# Patient Record
Sex: Male | Born: 1975 | Race: White | Hispanic: No | Marital: Single | Smoking: Never smoker
Health system: Southern US, Community
[De-identification: ages and names within clinical notes are randomized; demographics above are authoritative.]

## PROBLEM LIST (undated history)

## (undated) DIAGNOSIS — I10 Essential (primary) hypertension: Secondary | ICD-10-CM

## (undated) DIAGNOSIS — L709 Acne, unspecified: Secondary | ICD-10-CM

## (undated) DIAGNOSIS — K589 Irritable bowel syndrome without diarrhea: Secondary | ICD-10-CM

## (undated) DIAGNOSIS — E785 Hyperlipidemia, unspecified: Secondary | ICD-10-CM

## (undated) HISTORY — DX: Hyperlipidemia, unspecified: E78.5

## (undated) SURGERY — APPENDECTOMY, LAPAROSCOPIC
Anesthesia: General

---

## 2011-09-16 ENCOUNTER — Inpatient Hospital Stay (HOSPITAL_COMMUNITY)
Admission: EM | Admit: 2011-09-16 | Discharge: 2011-09-18 | DRG: 340 | Disposition: A | Discharge status: Home / Self Care | Payer: Commercial Managed Care - PPO | Attending: Surgery | Admitting: Surgery

## 2011-09-16 ENCOUNTER — Encounter: Payer: Self-pay | Admitting: Emergency Medicine

## 2011-09-16 DIAGNOSIS — L708 Other acne: Secondary | ICD-10-CM | POA: Diagnosis present

## 2011-09-16 DIAGNOSIS — K3580 Unspecified acute appendicitis: Secondary | ICD-10-CM | POA: Diagnosis present

## 2011-09-16 DIAGNOSIS — K352 Acute appendicitis with generalized peritonitis, without abscess: Principal | ICD-10-CM | POA: Diagnosis present

## 2011-09-16 DIAGNOSIS — I10 Essential (primary) hypertension: Secondary | ICD-10-CM | POA: Diagnosis present

## 2011-09-16 DIAGNOSIS — K589 Irritable bowel syndrome without diarrhea: Secondary | ICD-10-CM | POA: Diagnosis present

## 2011-09-16 DIAGNOSIS — K35209 Acute appendicitis with generalized peritonitis, without abscess, unspecified as to perforation: Principal | ICD-10-CM | POA: Diagnosis present

## 2011-09-16 DIAGNOSIS — Z79899 Other long term (current) drug therapy: Secondary | ICD-10-CM

## 2011-09-16 HISTORY — DX: Irritable bowel syndrome, unspecified: K58.9

## 2011-09-16 HISTORY — DX: Acne, unspecified: L70.9

## 2011-09-16 HISTORY — DX: Essential (primary) hypertension: I10

## 2011-09-16 LAB — DIFFERENTIAL
Eosinophils Relative: 0 % (ref 0–5)
Lymphocytes Relative: 2 % — ABNORMAL LOW (ref 12–46)
Lymphs Abs: 0.3 10*3/uL — ABNORMAL LOW (ref 0.7–4.0)
Monocytes Absolute: 0.6 10*3/uL (ref 0.1–1.0)
Monocytes Relative: 3 % (ref 3–12)

## 2011-09-16 LAB — CBC
HCT: 43 % (ref 39.0–52.0)
MCV: 90.9 fL (ref 78.0–100.0)
RBC: 4.73 MIL/uL (ref 4.22–5.81)
WBC: 17.4 10*3/uL — ABNORMAL HIGH (ref 4.0–10.5)

## 2011-09-16 NOTE — ED Notes (Signed)
PT. REPORTS UPPER ABDOMINAL PAIN WITH NAUSEA , NO VOMITTING OR DIARRHEA ONSET LAST NIGHT ,  ALSO REPORTS CONSTIPATION ONSET TODAY.

## 2011-09-17 ENCOUNTER — Encounter (HOSPITAL_COMMUNITY): Admission: EM | Disposition: A | Discharge status: Home / Self Care | Payer: Self-pay | Source: Home / Self Care

## 2011-09-17 ENCOUNTER — Encounter (HOSPITAL_COMMUNITY): Payer: Self-pay | Admitting: Radiology

## 2011-09-17 ENCOUNTER — Emergency Department (HOSPITAL_COMMUNITY): Payer: Commercial Managed Care - PPO

## 2011-09-17 ENCOUNTER — Emergency Department (HOSPITAL_COMMUNITY): Payer: Commercial Managed Care - PPO | Admitting: Anesthesiology

## 2011-09-17 ENCOUNTER — Encounter (HOSPITAL_COMMUNITY): Payer: Self-pay

## 2011-09-17 ENCOUNTER — Encounter (HOSPITAL_COMMUNITY): Payer: Self-pay | Admitting: Anesthesiology

## 2011-09-17 ENCOUNTER — Other Ambulatory Visit (INDEPENDENT_AMBULATORY_CARE_PROVIDER_SITE_OTHER): Payer: Self-pay | Admitting: Surgery

## 2011-09-17 DIAGNOSIS — K3580 Unspecified acute appendicitis: Secondary | ICD-10-CM

## 2011-09-17 DIAGNOSIS — R1031 Right lower quadrant pain: Secondary | ICD-10-CM

## 2011-09-17 HISTORY — PX: LAPAROSCOPIC APPENDECTOMY: SHX408

## 2011-09-17 HISTORY — PX: APPENDECTOMY: SHX54

## 2011-09-17 LAB — CBC
Hemoglobin: 13 g/dL (ref 13.0–17.0)
MCH: 31.3 pg (ref 26.0–34.0)
MCH: 30.7 pg (ref 26.0–34.0)
MCHC: 34.1 g/dL (ref 30.0–36.0)
Platelets: 193 10*3/uL (ref 150–400)
RBC: 4.5 MIL/uL (ref 4.22–5.81)
RBC: 4.23 MIL/uL (ref 4.22–5.81)
RDW: 12.5 % (ref 11.5–15.5)
WBC: 15.6 10*3/uL — ABNORMAL HIGH (ref 4.0–10.5)

## 2011-09-17 LAB — URINE MICROSCOPIC-ADD ON

## 2011-09-17 LAB — CREATININE, SERUM
Creatinine, Ser: 0.74 mg/dL (ref 0.50–1.35)
GFR calc Af Amer: >90 mL/min (ref >90)
GFR calc non Af Amer: >90 mL/min (ref >90)

## 2011-09-17 LAB — COMPREHENSIVE METABOLIC PANEL
ALT: 17 U/L (ref 0–53)
CO2: 22 mEq/L (ref 19–32)
Calcium: 10.1 mg/dL (ref 8.4–10.5)
Creatinine, Ser: 0.82 mg/dL (ref 0.50–1.35)
GFR calc Af Amer: >90 mL/min (ref >90)
GFR calc non Af Amer: >90 mL/min (ref >90)
Glucose, Bld: 137 mg/dL — ABNORMAL HIGH (ref 70–99)
Sodium: 133 mEq/L — ABNORMAL LOW (ref 135–145)

## 2011-09-17 LAB — URINALYSIS, ROUTINE W REFLEX MICROSCOPIC
Nitrite: NEGATIVE
Specific Gravity, Urine: 1.027 (ref 1.005–1.030)
Urobilinogen, UA: 1 mg/dL (ref 0.0–1.0)

## 2011-09-17 LAB — LIPASE, BLOOD: Lipase: 14 U/L (ref 11–59)

## 2011-09-17 SURGERY — APPENDECTOMY, LAPAROSCOPIC
Anesthesia: General | Site: Abdomen | Wound class: Contaminated

## 2011-09-17 MED ORDER — ACETAMINOPHEN 325 MG PO TABS: 650.0000 mg | ORAL_TABLET | ORAL | Status: DC | PRN

## 2011-09-17 MED ORDER — SODIUM CHLORIDE 0.9 % IV SOLN
1.0000 g | Freq: Once | INTRAVENOUS | Status: DC
  Filled 2011-09-17: qty 1

## 2011-09-17 MED ORDER — PROPOFOL 10 MG/ML IV EMUL
INTRAVENOUS | Status: DC | PRN
  Administered 2011-09-17: 200 mg via INTRAVENOUS

## 2011-09-17 MED ORDER — GLYCOPYRROLATE 0.2 MG/ML IJ SOLN
INTRAMUSCULAR | Status: DC | PRN
  Administered 2011-09-17: 1 mg via INTRAVENOUS

## 2011-09-17 MED ORDER — HYDROCODONE-ACETAMINOPHEN 5-325 MG PO TABS
1.0000 | ORAL_TABLET | ORAL | Status: DC | PRN
  Administered 2011-09-18: 2 via ORAL
  Filled 2011-09-17: qty 2

## 2011-09-17 MED ORDER — MORPHINE SULFATE 2 MG/ML IJ SOLN
1.0000 mg | INTRAMUSCULAR | Status: DC | PRN
  Administered 2011-09-17: 2 mg via INTRAVENOUS
  Filled 2011-09-17: qty 1

## 2011-09-17 MED ORDER — HYDROMORPHONE HCL PF 1 MG/ML IJ SOLN
1.0000 mg | Freq: Once | INTRAMUSCULAR | Status: AC
  Administered 2011-09-17: 1 mg via INTRAVENOUS
  Filled 2011-09-17: qty 1

## 2011-09-17 MED ORDER — HYDROMORPHONE HCL PF 1 MG/ML IJ SOLN
0.2500 mg | INTRAMUSCULAR | Status: DC | PRN
  Administered 2011-09-17 (×2): 0.25 mg via INTRAVENOUS
  Administered 2011-09-17: 0.5 mg via INTRAVENOUS

## 2011-09-17 MED ORDER — MEPERIDINE HCL 25 MG/ML IJ SOLN: 6.2500 mg | INTRAMUSCULAR | Status: DC | PRN

## 2011-09-17 MED ORDER — ENOXAPARIN SODIUM 40 MG/0.4ML ~~LOC~~ SOLN
40.0000 mg | SUBCUTANEOUS | Status: DC
  Administered 2011-09-18: 40 mg via SUBCUTANEOUS
  Filled 2011-09-17 (×2): qty 0.4

## 2011-09-17 MED ORDER — MORPHINE SULFATE 2 MG/ML IJ SOLN
2.0000 mg | INTRAMUSCULAR | Status: DC | PRN
  Administered 2011-09-17 (×2): 2 mg via INTRAVENOUS
  Filled 2011-09-17 (×2): qty 1

## 2011-09-17 MED ORDER — LISINOPRIL 20 MG PO TABS
20.0000 mg | ORAL_TABLET | Freq: Every day | ORAL | Status: DC
  Administered 2011-09-17 – 2011-09-18 (×2): 20 mg via ORAL
  Filled 2011-09-17 (×2): qty 1

## 2011-09-17 MED ORDER — LACTATED RINGERS IV SOLN
INTRAVENOUS | Status: DC | PRN
  Administered 2011-09-17 (×2): via INTRAVENOUS

## 2011-09-17 MED ORDER — BUPIVACAINE-EPINEPHRINE 0.5% -1:200000 IJ SOLN
INTRAMUSCULAR | Status: DC | PRN
  Administered 2011-09-17: 20 mL

## 2011-09-17 MED ORDER — PROMETHAZINE HCL 25 MG/ML IJ SOLN: 12.5000 mg | Freq: Four times a day (QID) | INTRAMUSCULAR | Status: DC | PRN

## 2011-09-17 MED ORDER — SODIUM CHLORIDE 0.9 % IV SOLN
1.0000 g | INTRAVENOUS | Status: DC
  Filled 2011-09-17: qty 1

## 2011-09-17 MED ORDER — KCL IN DEXTROSE-NACL 20-5-0.9 MEQ/L-%-% IV SOLN
INTRAVENOUS | Status: DC
  Administered 2011-09-17 – 2011-09-18 (×2): via INTRAVENOUS
  Filled 2011-09-17 (×7): qty 1000

## 2011-09-17 MED ORDER — POTASSIUM CHLORIDE IN NACL 20-0.9 MEQ/L-% IV SOLN
INTRAVENOUS | Status: DC
  Filled 2011-09-17 (×2): qty 1000

## 2011-09-17 MED ORDER — ONDANSETRON HCL 4 MG/2ML IJ SOLN
4.0000 mg | Freq: Once | INTRAMUSCULAR | Status: AC
  Administered 2011-09-17: 4 mg via INTRAVENOUS
  Filled 2011-09-17: qty 2

## 2011-09-17 MED ORDER — MIDAZOLAM HCL 5 MG/5ML IJ SOLN
INTRAMUSCULAR | Status: DC | PRN
  Administered 2011-09-17: 2 mg via INTRAVENOUS

## 2011-09-17 MED ORDER — KETOROLAC TROMETHAMINE 30 MG/ML IJ SOLN
30.0000 mg | Freq: Four times a day (QID) | INTRAMUSCULAR | Status: DC
  Administered 2011-09-17 – 2011-09-18 (×4): 30 mg via INTRAVENOUS
  Filled 2011-09-17 (×8): qty 1

## 2011-09-17 MED ORDER — SODIUM CHLORIDE 0.9 % IR SOLN
Status: DC | PRN
  Administered 2011-09-17: 2000 mL

## 2011-09-17 MED ORDER — FENTANYL CITRATE 0.05 MG/ML IJ SOLN
INTRAMUSCULAR | Status: DC | PRN
  Administered 2011-09-17: 100 ug via INTRAVENOUS

## 2011-09-17 MED ORDER — PROMETHAZINE HCL 25 MG/ML IJ SOLN: 6.2500 mg | INTRAMUSCULAR | Status: DC | PRN

## 2011-09-17 MED ORDER — ATROPINE SULFATE 1 MG/ML IJ SOLN
INTRAMUSCULAR | Status: DC | PRN
  Administered 2011-09-17: .6 mg via INTRAVENOUS

## 2011-09-17 MED ORDER — CEFAZOLIN SODIUM 1-5 GM-% IV SOLN
INTRAVENOUS | Status: DC | PRN
  Administered 2011-09-17: 1 g via INTRAVENOUS

## 2011-09-17 MED ORDER — DIPHENHYDRAMINE HCL 50 MG/ML IJ SOLN: 12.5000 mg | Freq: Four times a day (QID) | INTRAMUSCULAR | Status: DC | PRN

## 2011-09-17 MED ORDER — VECURONIUM BROMIDE 10 MG IV SOLR
INTRAVENOUS | Status: DC | PRN
  Administered 2011-09-17: 5 mg via INTRAVENOUS

## 2011-09-17 MED ORDER — LACTATED RINGERS IV SOLN
INTRAVENOUS | Status: DC
  Administered 2011-09-17: 10:00:00 via INTRAVENOUS

## 2011-09-17 MED ORDER — IOHEXOL 300 MG/ML  SOLN
100.0000 mL | Freq: Once | INTRAMUSCULAR | Status: AC | PRN
  Administered 2011-09-17: 100 mL via INTRAVENOUS

## 2011-09-17 MED ORDER — SUCCINYLCHOLINE CHLORIDE 20 MG/ML IJ SOLN
INTRAMUSCULAR | Status: DC | PRN
  Administered 2011-09-17: 100 mg via INTRAVENOUS

## 2011-09-17 MED ORDER — DIPHENHYDRAMINE HCL 12.5 MG/5ML PO ELIX
12.5000 mg | ORAL_SOLUTION | Freq: Four times a day (QID) | ORAL | Status: DC | PRN
  Filled 2011-09-17: qty 10

## 2011-09-17 MED ORDER — SODIUM CHLORIDE 0.9 % IV BOLUS (SEPSIS)
1000.0000 mL | Freq: Once | INTRAVENOUS | Status: AC
  Administered 2011-09-17: 1000 mL via INTRAVENOUS

## 2011-09-17 MED ORDER — NEOSTIGMINE METHYLSULFATE 1 MG/ML IJ SOLN
INTRAMUSCULAR | Status: DC | PRN
  Administered 2011-09-17: 5 mg via INTRAVENOUS

## 2011-09-17 SURGICAL SUPPLY — 37 items
APPLIER CLIP LOGIC TI 5 (MISCELLANEOUS) IMPLANT
APPLIER CLIP ROT 10 11.4 M/L (STAPLE)
BANDAGE ADHESIVE 1X3 (GAUZE/BANDAGES/DRESSINGS) ×6 IMPLANT
BENZOIN TINCTURE PRP APPL 2/3 (GAUZE/BANDAGES/DRESSINGS) ×2 IMPLANT
CANISTER SUCTION 2500CC (MISCELLANEOUS) ×2 IMPLANT
CHLORAPREP W/TINT 26ML (MISCELLANEOUS) ×2 IMPLANT
CLIP APPLIE ROT 10 11.4 M/L (STAPLE) IMPLANT
CLOTH BEACON ORANGE TIMEOUT ST (SAFETY) ×2 IMPLANT
COVER SURGICAL LIGHT HANDLE (MISCELLANEOUS) ×2 IMPLANT
CUTTER LINEAR ENDO 35 ETS (STAPLE) ×2 IMPLANT
CUTTER LINEAR ENDO 35 ETS TH (STAPLE) IMPLANT
DECANTER SPIKE VIAL GLASS SM (MISCELLANEOUS) ×2 IMPLANT
ELECT REM PT RETURN 9FT ADLT (ELECTROSURGICAL) ×2
ELECTRODE REM PT RTRN 9FT ADLT (ELECTROSURGICAL) ×1 IMPLANT
ENDOLOOP SUT PDS II  0 18 (SUTURE)
ENDOLOOP SUT PDS II 0 18 (SUTURE) IMPLANT
GLOVE SURG SIGNA 7.5 PF LTX (GLOVE) ×2 IMPLANT
GOWN PREVENTION PLUS XLARGE (GOWN DISPOSABLE) ×2 IMPLANT
GOWN STRL NON-REIN LRG LVL3 (GOWN DISPOSABLE) ×2 IMPLANT
KIT BASIN OR (CUSTOM PROCEDURE TRAY) ×2 IMPLANT
KIT ROOM TURNOVER OR (KITS) ×2 IMPLANT
NS IRRIG 1000ML POUR BTL (IV SOLUTION) ×2 IMPLANT
PAD ARMBOARD 7.5X6 YLW CONV (MISCELLANEOUS) ×2 IMPLANT
POUCH SPECIMEN RETRIEVAL 10MM (ENDOMECHANICALS) ×2 IMPLANT
RELOAD /EVU35 (ENDOMECHANICALS) IMPLANT
RELOAD CUTTER ETS 35MM STAND (ENDOMECHANICALS) IMPLANT
SCALPEL HARMONIC ACE (MISCELLANEOUS) ×2 IMPLANT
SET IRRIG TUBING LAPAROSCOPIC (IRRIGATION / IRRIGATOR) ×2 IMPLANT
SLEEVE ENDOPATH XCEL 5M (ENDOMECHANICALS) ×2 IMPLANT
SPECIMEN JAR SMALL (MISCELLANEOUS) ×2 IMPLANT
SUT MON AB 4-0 PC3 18 (SUTURE) ×2 IMPLANT
TOWEL OR 17X24 6PK STRL BLUE (TOWEL DISPOSABLE) ×2 IMPLANT
TOWEL OR 17X26 10 PK STRL BLUE (TOWEL DISPOSABLE) ×2 IMPLANT
TRAY LAPAROSCOPIC (CUSTOM PROCEDURE TRAY) ×2 IMPLANT
TROCAR XCEL BLUNT TIP 100MML (ENDOMECHANICALS) ×4 IMPLANT
TROCAR XCEL NON-BLD 5MMX100MML (ENDOMECHANICALS) ×2 IMPLANT
WATER STERILE IRR 1000ML POUR (IV SOLUTION) IMPLANT

## 2011-09-17 NOTE — ED Notes (Signed)
Patient presents with c/o pain to the epigastric area.  Stated that today he was unable to have a BM and took olive oil and OJ together.  Had no results.  Abd tender to touch denies N/V.  Urine sent to the lab

## 2011-09-17 NOTE — Op Note (Signed)
Appendectomy, Lap, Procedure Note  Indications: The patient presented with a history of right-sided abdominal pain. A CT revealed findings consistent with acute appendicitis.  Pre-operative Diagnosis: Acute appendicitis with generalized peritonitis  Post-operative Diagnosis: Acute appendicitis with generalized peritonitis  Surgeon: Abigail Miyamoto A   Assistants:   Anesthesia: General endotracheal anesthesia  ASA Class: 1  Procedure Details  The patient was seen again in the Holding Room. The risks, benefits, complications, treatment options, and expected outcomes were discussed with the patient and/or family. The possibilities of reaction to medication, perforation of viscus, bleeding, recurrent infection, finding a normal appendix, the need for additional procedures, failure to diagnose a condition, and creating a complication requiring transfusion or operation were discussed. There was concurrence with the proposed plan and informed consent was obtained. The site of surgery was properly noted. The patient was taken to Operating Room, identified as Ronte Conley-Biggs and the procedure verified as Appendectomy. A Time Out was held and the above information confirmed.  The patient was placed in the supine position and general anesthesia was induced, along with placement of orogastric tube, Venodyne boots, and a Foley catheter. The abdomen was prepped and draped in a sterile fashion. A one centimeter infraumbilical incision was made.  The umbilical stalk was elevated, and the midline fascia was incised with a #11 blade.  A Kelly clamp was used to confirm entrance into the peritoneal cavity.  A pursestring suture was passed around the incision with a 0 Vicryl.  The Hasson was introduced into the abdomen and the tails of the suture were used to hold the Hasson in place.   The pneumoperitoneum was then established to steady pressure of 15 mmHg.  Additional 5 mm cannulas then placed in the left  lower quadrant of the abdomen and the suprapubic region under direct visualization. A careful evaluation of the entire abdomen was carried out. The patient was placed in Trendelenburg and left lateral decubitus position. The small intestines were retracted in the cephalad and left lateral direction away from the pelvis and right lower quadrant. The patient was found to have an enlarged and inflamed appendix that was extending into the pelvis. There was no evidence of perforation.  The appendix was carefully dissected. The appendix was was skeletonized with the harmonic scalpel.   The appendix was divided at its base using an endo-GIA stapler. Minimal appendiceal stump was left in place. There was no evidence of bleeding, leakage, or complication after division of the appendix. Irrigation was also performed and irrigate suctioned from the abdomen as well.  The umbilical port site was closed with the purse string suture. There was no residual palpable fascial defect.  The trocar site skin wounds were closed with 4-0 Monocryl.  Instrument, sponge, and needle counts were correct at the conclusion of the case.   Findings: The appendix was found to be inflamed. There were signs of necrosis.  There was not perforation. There was not abscess formation.  Estimated Blood Loss:  Minimal         Drains: 0                Specimens: appendix         Complications:  None; patient tolerated the procedure well.         Disposition: PACU - hemodynamically stable.         Condition: stable

## 2011-09-17 NOTE — ED Notes (Signed)
Introduced self to pt. Pt remains on cardiac monitor, vital signs stable. Pt reports that his pain is at good level at this time. No acute distress noted. Will continue to monitor.

## 2011-09-17 NOTE — Progress Notes (Signed)
All checklist information confirmed with Patient in Holding 

## 2011-09-17 NOTE — Anesthesia Postprocedure Evaluation (Signed)
  Anesthesia Post-op Note  Patient: Dakota Harris  Procedure(s) Performed:  APPENDECTOMY LAPAROSCOPIC  Patient Location: PACU  Anesthesia Type: General  Level of Consciousness: sedated  Airway and Oxygen Therapy: Patient Spontanous Breathing and Patient connected to nasal cannula oxygen  Post-op Pain: moderate  Post-op Assessment: Post-op Vital signs reviewed, Patient's Cardiovascular Status Stable, Respiratory Function Stable, Patent Airway and No signs of Nausea or vomiting  Post-op Vital Signs: stable  Complications: No apparent anesthesia complications

## 2011-09-17 NOTE — H&P (Signed)
Chief Complaint  Patient presents with  . Abdominal Pain    HISTORY: The patient presents with 36 hours of abdominal discomfort.  He started having bloating and pain 2 nights previously.  He tried to have a bowel movement yesterday, but this did not provide any relief.  He has had anorexia and nausea, but no vomiting.  He also describes rigors.  He has not had any diarrhea or constipation.  He has had some occasional bloating and epigastric pain over the last 8 months.    Past Medical History  Diagnosis Date  . Irritable bowel syndrome (IBS)   . Hypertension   . Acne     Current Outpatient Prescriptions  Medication Sig Dispense Refill  . doxycycline (VIBRA-TABS) 100 MG tablet Take 100 mg by mouth daily.        Marland Kitchen lisinopril (PRINIVIL,ZESTRIL) 20 MG tablet Take 20 mg by mouth daily.          ALLERGIES:   No Known Allergies   History reviewed. No pertinent family history.   History   Social History  . Marital Status: Single    Spouse Name: N/A    Number of Children: N/A  . Years of Education: N/A   Social History Main Topics  . Smoking status: Never Smoker   . Smokeless tobacco: None  . Alcohol Use: Yes     OCCASIONAL  . Drug Use: No  . Sexually Active:     REVIEW OF SYSTEMS - PERTINENT POSITIVES ONLY: 12 point review of systems negative other than HPI and PMH except for ingrown hair.  EXAM: Filed Vitals:   09/17/11 0600  BP: 113/58  Pulse: 79  Temp: 97.1 F (36.2 C)  Resp:     Gen:  No acute distress.  Well nourished and well groomed.  Beard. Neurological: Alert and oriented to person, place, and time. Coordination normal.  Head: Normocephalic and atraumatic.  Eyes: Conjunctivae are normal. Pupils are equal, round, and reactive to light. No scleral icterus.  Neck: Normal range of motion. Neck supple. No tracheal deviation or thyromegaly present.  Cardiovascular: Normal rate, regular rhythm, normal heart sounds and intact distal pulses.  Exam reveals no  gallop and no friction rub.  No murmur heard. Respiratory: Effort normal.  No respiratory distress. No chest wall tenderness. Breath sounds normal.  No wheezes, rales or rhonchi.  GI: Soft. Bowel sounds are normal. The abdomen is slightly bloated, and tender.  Worse tenderness in the RLQ and suprapubic.    Musculoskeletal: Normal range of motion. Extremities are nontender.  Lymphadenopathy: No cervical, preauricular, postauricular or adenopathy is present Skin: Skin is warm and dry. No rash noted. No diaphoresis. No erythema. No pallor. No clubbing, cyanosis, or edema.   Psychiatric: Normal mood and affect. Behavior is normal. Judgment and thought content normal.    LABORATORY RESULTS: Available labs are reviewed  WBCs 20k, +ketones   RADIOLOGY RESULTS: Images and reports are reviewed.  CT abd/pelvis  IMPRESSION:  1. Mild acute appendicitis, with dilatation of the appendix to 1.0  cm in diameter, and surrounding soft tissue inflammation and trace  fluid. No evidence of perforation or abscess formation.  2. 2 mm nonobstructing stone noted near the lower pole of the left  kidney; nonspecific perinephric stranding noted bilaterally.   ASSESSMENT AND PLAN:  Acute appendicitis (09/17/2011)    Plan: IV Fluids, IV Antibiotics, NPO, to OR for laparoscopic appendectomy.  Dr. Magnus Ivan will be doing case.    Appendectomy was described to the  patient.  The incisions and surgical technique were explained.  The patient was advised that some of the hair on the abdomen would be clipped, and that a foley catheter would be placed.  I advised the patient of the risks of surgery including, but not limited to, bleeding, infection, damage to other structures, risk of an open operation, risk of abscess, and risk of blood clot.  The recovery was also described to the patient.  He was advised that he will have lifting restrictions for 2 weeks.        Maudry Diego MD Surgical Oncology, General and  Endocrine Surgery [facility]      Visit Diagnoses: 1. Acute appendicitis     Primary Care Physician: Egbert Garibaldi, NP, NP

## 2011-09-17 NOTE — Anesthesia Preprocedure Evaluation (Addendum)
Anesthesia Evaluation  Patient identified by MRN, date of birth, ID band Patient awake    Reviewed: Allergy & Precautions, H&P , NPO status , Patient's Chart, lab work & pertinent test results  Airway Mallampati: I TM Distance: >3 FB Neck ROM: full    Dental No notable dental hx. (+) Teeth Intact   Pulmonary neg pulmonary ROS,  clear to auscultation  Pulmonary exam normal       Cardiovascular hypertension, On Medications regular Normal    Neuro/Psych Negative Neurological ROS  Negative Psych ROS   GI/Hepatic negative GI ROS, Neg liver ROS,   Endo/Other  Negative Endocrine ROS  Renal/GU negative Renal ROS  Genitourinary negative   Musculoskeletal   Abdominal   Peds  Hematology negative hematology ROS (+)   Anesthesia Other Findings   Reproductive/Obstetrics negative OB ROS                           Anesthesia Physical Anesthesia Plan  ASA: II and Emergent  Anesthesia Plan: General   Post-op Pain Management:    Induction: Intravenous, Rapid sequence and Cricoid pressure planned  Airway Management Planned: Oral ETT  Additional Equipment:   Intra-op Plan:   Post-operative Plan: Extubation in OR  Informed Consent: I have reviewed the patients History and Physical, chart, labs and discussed the procedure including the risks, benefits and alternatives for the proposed anesthesia with the patient or authorized representative who has indicated his/her understanding and acceptance.     Plan Discussed with: CRNA  Anesthesia Plan Comments:        Anesthesia Quick Evaluation

## 2011-09-17 NOTE — ED Notes (Signed)
Back from CT

## 2011-09-17 NOTE — ED Notes (Signed)
Patient drinking contrast.  Meds given

## 2011-09-17 NOTE — Transfer of Care (Signed)
Immediate Anesthesia Transfer of Care Note  Patient: Dakota Harris  Procedure(s) Performed:  APPENDECTOMY LAPAROSCOPIC  Patient Location: PACU  Anesthesia Type: General  Level of Consciousness: awake, alert  and oriented  Airway & Oxygen Therapy: Patient Spontanous Breathing and Patient connected to nasal cannula oxygen  Post-op Assessment: Report given to PACU RN  Post vital signs: Reviewed and stable  Complications: No apparent anesthesia complications

## 2011-09-17 NOTE — ED Provider Notes (Signed)
History     CSN: [id number] Arrival date & time: 09/16/2011 11:13 PM   First MD Initiated Contact with Patient 09/17/11 0159      Chief Complaint  Patient presents with  . Abdominal Pain    (Consider location/radiation/quality/duration/timing/severity/associated sxs/prior treatment) Patient is a 35 y.o. male presenting with abdominal pain. The history is provided by the patient.  Abdominal Pain The primary symptoms of the illness include abdominal pain.   reports 24 hours of crampy lower abdominal pain associated with nausea and vomiting.  Chills without fever.  No diarrhea.  He does report a bowel movement today.  No prior history of ureteral stones.  Denies dysuria or urinary frequency.  His symptoms are worsened by palpation.  Nothing improves his symptoms.  He's had anorexia. His symptoms are moderate  Past Medical History  Diagnosis Date  . Irritable bowel syndrome (IBS)   . Hypertension   . Acne     History reviewed. No pertinent past surgical history.  No family history on file.  History  Substance Use Topics  . Smoking status: Never Smoker   . Smokeless tobacco: Not on file  . Alcohol Use: Yes     OCCASIONAL      Review of Systems  Gastrointestinal: Positive for abdominal pain.  All other systems reviewed and are negative.    Allergies  Review of patient's allergies indicates no known allergies.  Home Medications   Current Outpatient Rx  Name Route Sig Dispense Refill  . DOXYCYCLINE HYCLATE 100 MG PO TABS Oral Take 100 mg by mouth daily.      Marland Kitchen LISINOPRIL 20 MG PO TABS Oral Take 20 mg by mouth daily.        BP 120/62  Pulse 93  Temp(Src) 98.9 F (37.2 C) (Oral)  Resp 12  SpO2 99%  Physical Exam  Nursing note and vitals reviewed. Constitutional: He is oriented to person, place, and time. He appears well-developed and well-nourished.  HENT:  Head: Normocephalic and atraumatic.  Eyes: EOM are normal.  Neck: Normal range of motion.    Cardiovascular: Normal rate, regular rhythm, normal heart sounds and intact distal pulses.   Pulmonary/Chest: Effort normal and breath sounds normal. No respiratory distress.  Abdominal: Soft. He exhibits no distension.       Tenderness with guarding in bilateral lower arteries without rebound or peritonitis on exam.  Musculoskeletal: Normal range of motion.  Neurological: He is alert and oriented to person, place, and time.  Skin: Skin is warm and dry.  Psychiatric: He has a normal mood and affect. Judgment normal.    ED Course  Procedures (including critical care time)  Labs Reviewed  CBC - Abnormal; Notable for the following:    WBC 17.4 (*)    All other components within normal limits  DIFFERENTIAL - Abnormal; Notable for the following:    Neutrophils Relative 95 (*)    Neutro Abs 16.5 (*)    Lymphocytes Relative 2 (*)    Lymphs Abs 0.3 (*)    All other components within normal limits  COMPREHENSIVE METABOLIC PANEL - Abnormal; Notable for the following:    Sodium 133 (*)    Glucose, Bld 137 (*)    All other components within normal limits  URINALYSIS, ROUTINE W REFLEX MICROSCOPIC - Abnormal; Notable for the following:    Color, Urine AMBER (*) BIOCHEMICALS MAY BE AFFECTED BY COLOR   Bilirubin Urine SMALL (*)    Ketones, ur >80 (*)    Protein, ur  100 (*)    Leukocytes, UA SMALL (*)    All other components within normal limits  CBC - Abnormal; Notable for the following:    WBC 20.4 (*)    All other components within normal limits  URINE MICROSCOPIC-ADD ON - Abnormal; Notable for the following:    Squamous Epithelial / LPF FEW (*)    Bacteria, UA FEW (*)    Casts HYALINE CASTS (*)    All other components within normal limits  LIPASE, BLOOD   Ct Abdomen Pelvis W Contrast  09/17/2011  *RADIOLOGY REPORT*  Clinical Data: Upper abdominal pain and nausea.  Constipation.  CT ABDOMEN AND PELVIS WITH CONTRAST  Technique:  Multidetector CT imaging of the abdomen and pelvis was  performed following the standard protocol during bolus administration of intravenous contrast.  Contrast: OMNIPAQUE IOHEXOL 300 MG/ML IV SOLN  Comparison: None.  Findings: Minimal bibasilar atelectasis is noted.  The liver and spleen are unremarkable in appearance.  The gallbladder is within normal limits.  The pancreas and adrenal glands are unremarkable.  Nonspecific perinephric stranding is noted bilaterally, mildly worse on the left side.  There is a 2 mm nonobstructing renal stone noted near the lower pole of the left kidney.  The kidneys are otherwise unremarkable in appearance.  There is no evidence of hydronephrosis.  No obstructing ureteral stones are seen.  No free fluid is identified.  The small bowel is unremarkable in appearance.  The stomach is filled with contrast and is within normal limits.  No acute vascular abnormalities are seen.  The appendix is dilated to 1.0 cm in maximal diameter, with surrounding soft tissue inflammation and trace fluid, compatible with mild acute appendicitis.  There is no evidence for perforation or abscess formation.  No significant pericecal lymphadenopathy is seen.  The colon is largely decompressed and is unremarkable in appearance.  The bladder is moderately distended and grossly unremarkable in appearance.  The prostate remains normal in size.  No inguinal lymphadenopathy is seen.  No acute osseous abnormalities are identified.  Minimal disc space narrowing is noted at L5-S1.  IMPRESSION:  1.  Mild acute appendicitis, with dilatation of the appendix to 1.0 cm in diameter, and surrounding soft tissue inflammation and trace fluid.  No evidence of perforation or abscess formation. 2.  2 mm nonobstructing stone noted near the lower pole of the left kidney; nonspecific perinephric stranding noted bilaterally.  Findings were discussed with Dr. Azalia Bilis at 06:12 a.m. on 09/17/2011.  Original Report Authenticated By: Tonia Ghent, M.D.   I personally reviewed  his CT scan and discussed the findings with the radiologist  1. Acute appendicitis       MDM  Acute appendicitis on CT scan.  Discussed findings with general surgery who will evaluate the patient in the emergency department.Dr Donell Beers  6:26 AM Pt informed of findings and NPO status        Lyanne Co, MD 09/17/11 [phone]

## 2011-09-17 NOTE — Anesthesia Procedure Notes (Signed)
Procedure Name: Intubation Date/Time: 09/17/2011 10:11 AM Performed by: Carmela Rima Pre-anesthesia Checklist: Timeout performed, Patient identified, Emergency Drugs available, Suction available and Patient being monitored Patient Re-evaluated:Patient Re-evaluated prior to inductionOxygen Delivery Method: Circle System Utilized Preoxygenation: Pre-oxygenation with 100% oxygen Intubation Type: IV induction and Rapid sequence Laryngoscope Size: Mac and 3 Tube size: 7.5 mm Number of attempts: 1 Placement Confirmation: ETT inserted through vocal cords under direct vision,  breath sounds checked- equal and bilateral,  positive ETCO2 and CO2 detector Secured at: 23 cm Tube secured with: Tape Dental Injury: Teeth and Oropharynx as per pre-operative assessment

## 2011-09-17 NOTE — ED Notes (Signed)
Report called to OR RN, tech to transport pt upstairs.

## 2011-09-17 NOTE — ED Notes (Signed)
Patient states that his pain in epigastric in nature but thinks it is because he is hungry  Instructed that he is NPO for the time

## 2011-09-17 NOTE — Preoperative (Signed)
Beta Blockers   Reason not to administer Beta Blockers:Not Applicable 

## 2011-09-17 NOTE — Progress Notes (Signed)
  Seen and agree with above plans.  Patient with acute appendicitis.  Plan Lap Appendectomy.  Risks discussed in detail including risks of bleeding, infection, injury to surrounding structures, stump leak, need to convert to an open procedure, etc. Likelihood of success is good.  Patient agrees to proceed.

## 2011-09-17 NOTE — ED Notes (Signed)
Awakened patient to finish contrast

## 2011-09-17 NOTE — ED Notes (Signed)
Dr Donell Beers in with patient

## 2011-09-18 LAB — CBC
Platelets: 153 10*3/uL (ref 150–400)
RBC: 3.74 MIL/uL — ABNORMAL LOW (ref 4.22–5.81)
RDW: 13.2 % (ref 11.5–15.5)
WBC: 8.3 10*3/uL (ref 4.0–10.5)

## 2011-09-18 MED ORDER — HYDROCODONE-ACETAMINOPHEN 5-325 MG PO TABS: 1.0000 | ORAL_TABLET | ORAL | Status: AC | PRN

## 2011-09-18 NOTE — Progress Notes (Signed)
I have seen and examined the patient and agree with the assessment and plans  

## 2011-09-18 NOTE — Progress Notes (Signed)
Dischg'd. Home with d/c instructions,wd. Care,f/u app't. Med. rec. 

## 2011-09-18 NOTE — Progress Notes (Signed)
1 Day Post-Op  Subjective: Pt ok. No N/V, but hasn't had reg breakfast yet. Pain control fine. Voiding well.  Objective: Vital signs in last 24 hours: Temp:  [97.9 F (36.6 C)-98.8 F (37.1 C)] 98.7 F (37.1 C) (11/14 0550) Pulse Rate:  [68-101] 85  (11/14 0550) Resp:  [11-23] 18  (11/14 0550) BP: (118-136)/(62-85) 121/78 mmHg (11/14 0550) SpO2:  [94 %-100 %] 99 % (11/14 0550) Last BM Date: 09/16/11  Intake/Output this shift: Total I/O In: -  Out: 225 [Urine:225]  Physical Exam: Lungs: CTA without w/r/r Heart: Regular Abdomen: soft, ND, appropriately tender   Incisions all c/d/i without erythema or hematoma. Ext: No edema or tenderness   Labs: CBC  Basename 09/17/11 1320 09/17/11 0228  WBC 15.6* 20.4*  HGB 13.0 14.1  HCT 39.8 41.3  PLT 178 193   BMET  Basename 09/17/11 1320 09/16/11 2328  NA -- 133*  K -- 3.5  CL -- 97  CO2 -- 22  GLUCOSE -- 137*  BUN -- 10  CREATININE 0.74 0.82  CALCIUM -- 10.1   LFT  Basename 09/17/11 0228 09/16/11 2328  PROT -- 7.7  ALBUMIN -- 4.4  AST -- 15  ALT -- 17  ALKPHOS -- 59  BILITOT -- 0.8  BILIDIR -- --  IBILI -- --  LIPASE 14 --   PT/INR No results found for this basename: LABPROT:2,INR:2 in the last 72 hours ABG No results found for this basename: PHART:2,PCO2:2,PO2:2,HCO3:2 in the last 72 hours  Studies/Results: Ct Abdomen Pelvis W Contrast  09/17/2011  *RADIOLOGY REPORT*  Clinical Data: Upper abdominal pain and nausea.  Constipation.  CT ABDOMEN AND PELVIS WITH CONTRAST  Technique:  Multidetector CT imaging of the abdomen and pelvis was performed following the standard protocol during bolus administration of intravenous contrast.  Contrast: OMNIPAQUE IOHEXOL 300 MG/ML IV SOLN  Comparison: None.  Findings: Minimal bibasilar atelectasis is noted.  The liver and spleen are unremarkable in appearance.  The gallbladder is within normal limits.  The pancreas and adrenal glands are unremarkable.  Nonspecific  perinephric stranding is noted bilaterally, mildly worse on the left side.  There is a 2 mm nonobstructing renal stone noted near the lower pole of the left kidney.  The kidneys are otherwise unremarkable in appearance.  There is no evidence of hydronephrosis.  No obstructing ureteral stones are seen.  No free fluid is identified.  The small bowel is unremarkable in appearance.  The stomach is filled with contrast and is within normal limits.  No acute vascular abnormalities are seen.  The appendix is dilated to 1.0 cm in maximal diameter, with surrounding soft tissue inflammation and trace fluid, compatible with mild acute appendicitis.  There is no evidence for perforation or abscess formation.  No significant pericecal lymphadenopathy is seen.  The colon is largely decompressed and is unremarkable in appearance.  The bladder is moderately distended and grossly unremarkable in appearance.  The prostate remains normal in size.  No inguinal lymphadenopathy is seen.  No acute osseous abnormalities are identified.  Minimal disc space narrowing is noted at L5-S1.  IMPRESSION:  1.  Mild acute appendicitis, with dilatation of the appendix to 1.0 cm in diameter, and surrounding soft tissue inflammation and trace fluid.  No evidence of perforation or abscess formation. 2.  2 mm nonobstructing stone noted near the lower pole of the left kidney; nonspecific perinephric stranding noted bilaterally.  Findings were discussed with Dr. Azalia Bilis at 06:12 a.m. on 09/17/2011.  Original  Report Authenticated By: Tonia Ghent, M.D.     Assessment: Active Problems:  Acute appendicitis  s/p Procedure(s): APPENDECTOMY LAPAROSCOPIC Plan: DC home today  LOS: 2 days    Dakota Harris 09/18/2011

## 2011-09-18 NOTE — Discharge Summary (Signed)
Physician Discharge Summary  Patient ID: Dakota Harris MRN: [id number] DOB/AGE: 1976/09/12 35 y.o.  Admit date: 09/16/2011 Discharge date: 09/18/2011  Admission Diagnoses: Acute appendicitis Discharge Diagnoses:  Active Problems:  Acute appendicitis  Procedure(s): APPENDECTOMY LAPAROSCOPIC  Discharged Condition: good  Hospital Course: Admitted from ED after finding of appendicitis on CT. Taken to OR for lap appy, no complications during or post-op. He is eating, voiding, and walking.  Consults: none   Discharge Exam: Blood pressure 121/78, pulse 85, temperature 98.7 F (37.1 C), temperature source Axillary, resp. rate 18, SpO2 99.00%. Lungs: CTA without w/r/r Heart: Regular Abdomen: soft, ND, appropriately tender   Incisions all c/d/i without erythema or hematoma. Ext: No edema or tenderness   Disposition: Final discharge disposition not confirmed  Discharge Orders    Future Orders Please Complete By Expires   Diet - low sodium heart healthy      Increase activity slowly      Discharge instructions      Comments:   [facility] ______[facility] LAPAROSCOPIC SURGERY: POST OP INSTRUCTIONS Always review your discharge instruction sheet given to you by the facility where your surgery was performed. IF YOU HAVE DISABILITY OR FAMILY LEAVE FORMS, YOU MUST BRING THEM TO THE OFFICE FOR PROCESSING.   DO NOT GIVE THEM TO YOUR DOCTOR.  A prescription for pain medication may be given to you upon discharge.  Take your pain medication as prescribed, if needed.  If narcotic pain medicine is not needed, then you may take acetaminophen (Tylenol) or ibuprofen (Advil) as needed. Take your usually prescribed medications unless otherwise directed. If you need a refill on your pain medication, please contact your pharmacy.  They will contact our office to request authorization. Prescriptions will not be filled after 5pm or on week-ends. You should follow a light diet the  first few days after arrival home, such as soup and crackers, etc.  Be sure to include lots of fluids daily. Most patients will experience some swelling and bruising in the area of the incisions.  Ice packs will help.  Swelling and bruising can take several days to resolve.  It is common to experience some constipation if taking pain medication after surgery.  Increasing fluid intake and taking a stool softener (such as Colace) will usually help or prevent this problem from occurring.  A mild laxative (Milk of Magnesia or Miralax) should be taken according to package instructions if there are no bowel movements after 48 hours. Unless discharge instructions indicate otherwise, you may remove your bandages 24-48 hours after surgery, and you may shower at that time.  You may have steri-strips (small skin tapes) in place directly over the incision.  These strips should be left on the skin for 7-10 days.  If your surgeon used skin glue on the incision, you may shower in 24 hours.  The glue will flake off over the next 2-3 weeks.  Any sutures or staples will be removed at the office during your follow-up visit. ACTIVITIES:  You may resume regular (light) daily activities beginning the next day-such as daily self-care, walking, climbing stairs-gradually increasing activities as tolerated.  You may have sexual intercourse when it is comfortable.  Refrain from any heavy lifting or straining until approved by your doctor. You may drive when you are no longer taking prescription pain medication, you can comfortably wear a seatbelt, and you can safely maneuver your car and apply brakes. RETURN TO WORK:  __________________________________________________________ Bonita Quin should see your doctor in the office  for a follow-up appointment approximately 2-3 weeks after your surgery.  Make sure that you call for this appointment within a day or two after you arrive home to insure a convenient appointment time. OTHER INSTRUCTIONS:  __________________________________________________________________________________________________________________________ __________________________________________________________________________________________________________________________ WHEN TO CALL YOUR DOCTOR: Fever over 101.0 Inability to urinate Continued bleeding from incision. Increased pain, redness, or drainage from the incision. Increasing abdominal pain  The clinic staff is available to answer your questions during regular business hours.  Please don't hesitate to call and ask to speak to one of the nurses for clinical concerns.  If you have a medical emergency, go to the nearest emergency room or call 911.  A surgeon from [facility] is always on call at the hospital. [address], [address], [place], [place]  [address] ? [address] [address], [place], [place]   [address] [phone] ? [phone] ? FAX [phone] Web site: [email or web address]    May shower / Bathe      Lifting restrictions      Comments:   No heavy lifting more than 10lbs for 2-3 weeks.   Remove dressing in 24 hours      Call MD for:  redness, tenderness, or signs of infection (pain, swelling, redness, odor or green/yellow discharge around incision site)      Call MD for:  severe uncontrolled pain      Call MD for:  persistant nausea and vomiting      Call MD for:  temperature >100.4        Current Discharge Medication List    START taking these medications   Details  HYDROcodone-acetaminophen (NORCO) 5-325 MG per tablet Take 1-2 tablets by mouth every 4 (four) hours as needed for pain. Qty: 30 tablet, Refills: 0      CONTINUE these medications which have NOT CHANGED   Details  doxycycline (VIBRA-TABS) 100 MG tablet Take 100 mg by mouth daily.      lisinopril (PRINIVIL,ZESTRIL) 20 MG tablet Take 20 mg by mouth daily.         Follow-up Information    Follow up with [facility],MD on 10/01/2011. ([facility] clinic 3:00pm)     Contact information:   [address] [place] [address] [phone]          Signed: Marianna Fuss 09/18/2011, 9:07 AM

## 2011-09-18 NOTE — Progress Notes (Signed)
Dischg'd. Home with d/c instructions,wd. Care,f/u app't. Med. rec.

## 2011-09-19 ENCOUNTER — Encounter (HOSPITAL_COMMUNITY): Payer: Self-pay | Admitting: Surgery

## 2011-10-01 ENCOUNTER — Encounter (INDEPENDENT_AMBULATORY_CARE_PROVIDER_SITE_OTHER): Payer: Commercial Managed Care - PPO

## 2011-10-02 ENCOUNTER — Emergency Department (HOSPITAL_COMMUNITY)
Admission: EM | Admit: 2011-10-02 | Discharge: 2011-10-02 | Disposition: A | Discharge status: Home / Self Care | Payer: Commercial Managed Care - PPO | Attending: Emergency Medicine | Admitting: Emergency Medicine

## 2011-10-02 ENCOUNTER — Encounter (HOSPITAL_COMMUNITY): Payer: Self-pay | Admitting: *Deleted

## 2011-10-02 ENCOUNTER — Emergency Department (HOSPITAL_COMMUNITY): Payer: Commercial Managed Care - PPO

## 2011-10-02 ENCOUNTER — Telehealth (INDEPENDENT_AMBULATORY_CARE_PROVIDER_SITE_OTHER): Payer: Self-pay

## 2011-10-02 DIAGNOSIS — M549 Dorsalgia, unspecified: Secondary | ICD-10-CM

## 2011-10-02 DIAGNOSIS — Z79899 Other long term (current) drug therapy: Secondary | ICD-10-CM | POA: Insufficient documentation

## 2011-10-02 DIAGNOSIS — I10 Essential (primary) hypertension: Secondary | ICD-10-CM | POA: Insufficient documentation

## 2011-10-02 DIAGNOSIS — Z7982 Long term (current) use of aspirin: Secondary | ICD-10-CM | POA: Insufficient documentation

## 2011-10-02 DIAGNOSIS — R079 Chest pain, unspecified: Secondary | ICD-10-CM | POA: Insufficient documentation

## 2011-10-02 DIAGNOSIS — M546 Pain in thoracic spine: Secondary | ICD-10-CM | POA: Insufficient documentation

## 2011-10-02 DIAGNOSIS — K589 Irritable bowel syndrome without diarrhea: Secondary | ICD-10-CM | POA: Insufficient documentation

## 2011-10-02 LAB — BASIC METABOLIC PANEL
BUN: 11 mg/dL (ref 6–23)
CO2: 26 mEq/L (ref 19–32)
Calcium: 9.6 mg/dL (ref 8.4–10.5)
Creatinine, Ser: 0.66 mg/dL (ref 0.50–1.35)

## 2011-10-02 LAB — CBC
MCH: 30.9 pg (ref 26.0–34.0)
MCV: 93 fL (ref 78.0–100.0)
Platelets: 393 10*3/uL (ref 150–400)
RBC: 4.4 MIL/uL (ref 4.22–5.81)
RDW: 12.3 % (ref 11.5–15.5)

## 2011-10-02 LAB — CARDIAC PANEL(CRET KIN+CKTOT+MB+TROPI): Total CK: 70 U/L (ref 7–232)

## 2011-10-02 MED ORDER — IOHEXOL 300 MG/ML  SOLN
100.0000 mL | Freq: Once | INTRAMUSCULAR | Status: AC | PRN
  Administered 2011-10-02: 100 mL via INTRAVENOUS

## 2011-10-02 NOTE — ED Notes (Signed)
Reports episode over one week ago of back pain, radiating down his right arm, then pressure on his chest. Still having the intermittent back pain. No acute distress noted at triage, ekg being done. Had appendectomy 2 weeks ago.

## 2011-10-02 NOTE — ED Provider Notes (Signed)
History     CSN: [id number] Arrival date & time: 10/02/2011  1:09 PM   First MD Initiated Contact with Patient 10/02/11 1412      Chief Complaint  Patient presents with  . Chest Pain  . Back Pain    (Consider location/radiation/quality/duration/timing/severity/associated sxs/prior treatment) HPI Patient presents with several days of intermittent right upper back pain as well as right-sided chest pain. He is approximately 2 weeks status post appendectomy. He denies any leg swelling. He had no surgical complications and has no abdominal pain. He has not had any fever or cough. He's had no vomiting. The chest and back discomfort lasts for several minutes and then resolves spontaneously. It seems to be worse when he is moving around. He was advised to come to the ED by his primary care doctor's office.  Past Medical History  Diagnosis Date  . Irritable bowel syndrome (IBS)   . Hypertension   . Acne   . Chronic kidney disease     kidney stone  . Irritable bowel syndrome (IBS)     Past Surgical History  Procedure Date  . Appendectomy 09/17/2011  . Laparoscopic appendectomy 09/17/2011    Procedure: APPENDECTOMY LAPAROSCOPIC;  Surgeon: Shelly Rubenstein, MD;  Location: [facility] OR;  Service: General;  Laterality: N/A;    History reviewed. No pertinent family history.  History  Substance Use Topics  . Smoking status: Never Smoker   . Smokeless tobacco: Not on file  . Alcohol Use: Yes     OCCASIONAL      Review of Systems ROS reviewed and otherwise negative except for mentioned in HPI  Allergies  Lactose intolerance (gi)  Home Medications   Current Outpatient Rx  Name Route Sig Dispense Refill  . ASPIRIN EC 81 MG PO TBEC Oral Take 81 mg by mouth daily as needed. For pain     . DOXYCYCLINE HYCLATE 100 MG PO TABS Oral Take 100 mg by mouth daily.      Marland Kitchen LISINOPRIL 20 MG PO TABS Oral Take 20 mg by mouth daily.        BP 153/80  Pulse 103  Temp(Src) 98.4 F (36.9 C)  (Oral)  Resp 16  SpO2 100% Vitals reviewed Physical Exam Physical Examination: General appearance - alert, well appearing, and in no distress Mental status - alert, oriented to person, place, and time Mouth - mucous membranes moist, pharynx normal without lesions Chest - clear to auscultation, no wheezes, rales or rhonchi, symmetric air entry Heart - normal rate, regular rhythm, normal S1, S2, no murmurs, rubs, clicks or gallops Abdomen - soft, nontender, nondistended, no masses or organomegaly Neurological - alert, oriented, normal speech, no focal findings, sensation and motor exam grossly intact in extremities x 4 Musculoskeletal - no joint tenderness, deformity or swelling Extremities - peripheral pulses normal, no pedal edema, no clubbing or cyanosis Skin - normal coloration and turgor, no rashes  ED Course  Procedures (including critical care time)   Date: 10/02/2011  Rate: 99  Rhythm: normal sinus rhythm  QRS Axis: normal  Intervals: normal  ST/T Wave abnormalities: normal  Conduction Disutrbances:none  Narrative Interpretation:   Old EKG Reviewed: none available    Labs Reviewed  CBC  BASIC METABOLIC PANEL  CARDIAC PANEL(CRET KIN+CKTOT+MB+TROPI)   Ct Angio Chest W/cm &/or Wo Cm  10/02/2011  *RADIOLOGY REPORT*  Clinical Data:  Chest pain after appendectomy.  Shoulder pain.  CT ANGIOGRAPHY CHEST WITH CONTRAST  Technique:  Multidetector CT imaging of the chest was  performed using the standard protocol during bolus administration of intravenous contrast.  Multiplanar CT image reconstructions including MIPs were obtained to evaluate the vascular anatomy.  Contrast: OMNIPAQUE IOHEXOL 300 MG/ML IV SOLN  Comparison:  None.  Findings:  No pulmonary embolus.  Probable residual thymic tissue in the anterior mediastinum.  No pathologically enlarged mediastinal, hilar or axillary lymph nodes.  Heart size normal. There is calcification in the proximal left anterior descending  coronary artery.  Heart size normal.  No pericardial effusion.  Lungs are clear.  No pleural fluid.  Airway is unremarkable.  Review of the MIP images confirms the above findings.  IMPRESSION:  1.  No pulmonary embolus. 2.  Left anterior descending coronary artery calcification, advanced for age. These results were called by telephone on 10/02/2011  at  1619 hours to  Dr. Karma Ganja, who verbally acknowledged these results.  Original Report Authenticated By: Reyes Ivan, M.D.     1. Back pain       MDM  Patient presenting with pain in his right upper back and right upper chest intermittently since having appendectomy approximately 2 weeks ago. His CT chest was negative for pulmonary embolus. However it did show calcification in his LAD. His lab workup was unremarkable. He was advised and referred to see cardiology. He does have a history of hypertension however his EKG is normal and he is not having any chest symptoms during the ED evaluation.        Ethelda Chick, MD 10/02/11 [phone]

## 2011-10-02 NOTE — Telephone Encounter (Signed)
Pt  Called to r/s appt he missed yesterday. Pt states Sunday he started having chest pain that radiates into both arms and increases with exertion. Pt states he is back at work. I advised pt to see his PCP today or go to ER to rule out heart or lung issue. Pt states he will see Dr Aileen Fass his PCP now.

## 2011-10-08 ENCOUNTER — Ambulatory Visit (INDEPENDENT_AMBULATORY_CARE_PROVIDER_SITE_OTHER): Payer: Commercial Managed Care - PPO | Admitting: General Surgery

## 2011-10-08 ENCOUNTER — Encounter (INDEPENDENT_AMBULATORY_CARE_PROVIDER_SITE_OTHER): Payer: Self-pay

## 2011-10-08 DIAGNOSIS — K3580 Unspecified acute appendicitis: Secondary | ICD-10-CM

## 2011-10-08 NOTE — Patient Instructions (Signed)
Stick to diet, call us if you have a problem with you abdomen.

## 2011-10-08 NOTE — Progress Notes (Signed)
Tyreak CONLEY-BIGGS [birth year] [id number] 10/08/2011   History of Present Illness: Barbara Keng is a  36 y.o. male who presents today status post lap appy.  Pathology reveals  Acute suppurative Full thickness appendicitis and serositis..   The patient is tolerating a regular diet, having normal bowel movements, has good pain control.  He  is back to most normal activities. He had some post op chest pain and was seen in the ER and reported abnormal coronary Ca.  He has lost 25 lbs, with good diet, and walking and has a follow up with cardiology pending.  Physical Exam: Abd: soft, nontender, active bowel sounds, nondistended.  All incisions are well healed.  Impression: 1.  Acute appendicitis, s/p lap appy  Plan: He  is able to return to normal activities. He  may follow up on a prn basis.

## 2012-04-20 IMAGING — CT CT ABD-PELV W/ CM
2 of 4 series · 13 of 32 positions shown, 18 images · IV contrast (water/omni  & 100ml omni 300)
Comparison: None.

CLINICAL DATA: Upper abdominal pain and nausea.  Constipation.

CT ABDOMEN AND PELVIS WITH CONTRAST
TECHNIQUE: Multidetector CT imaging of the abdomen and pelvis was
performed following the standard protocol during bolus
administration of intravenous contrast.
Contrast: 100mL OMNIPAQUE IOHEXOL 300 MG/ML IV SOLN

[Series 2: routine abdomen · axial · 0.95mm/px · z∈[-528,-108]mm · 8 of 109 slices shown, 13 images]
[im 13/109  soft-tissue]
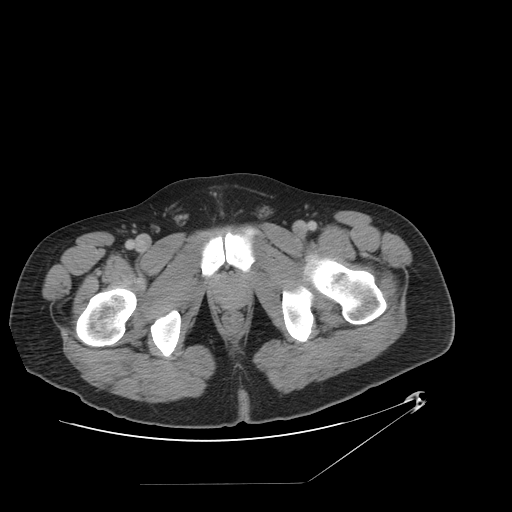
[im 13/109  bone]
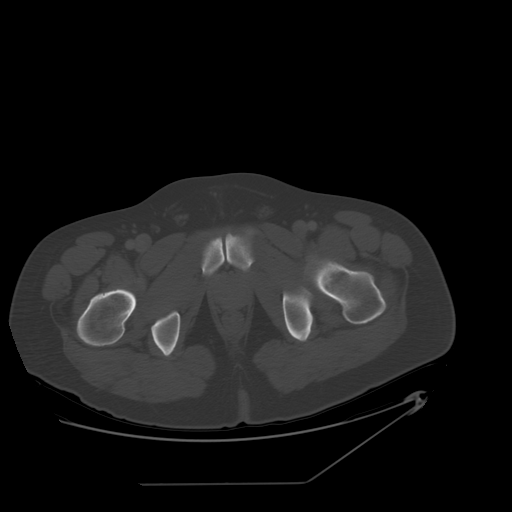
[im 25/109  soft-tissue]
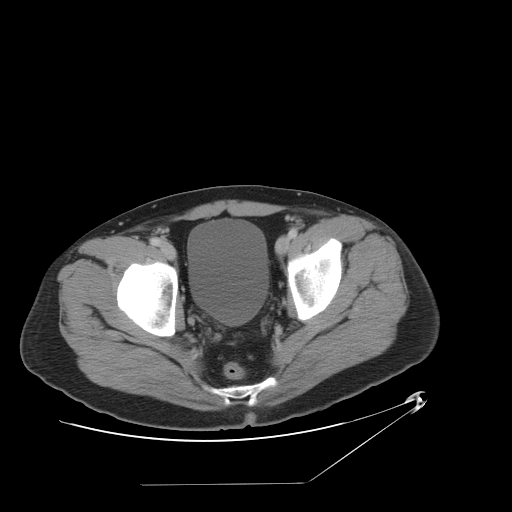
[im 37/109  soft-tissue]
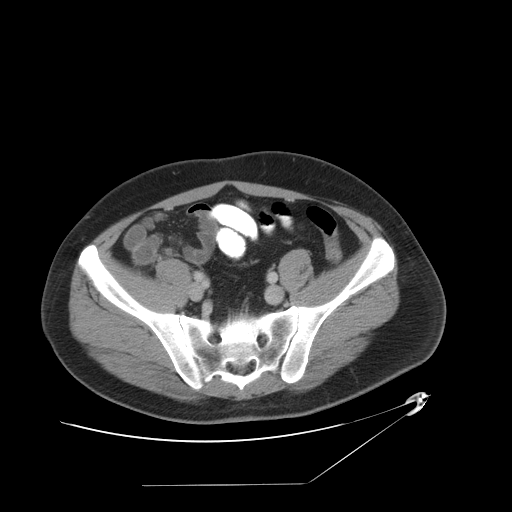
[im 49/109  soft-tissue]
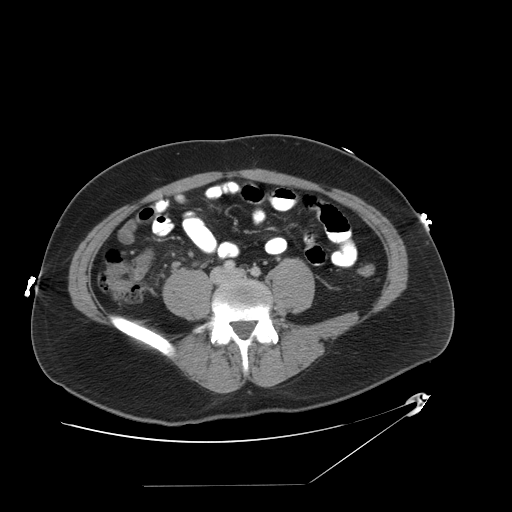
[im 61/109  soft-tissue]
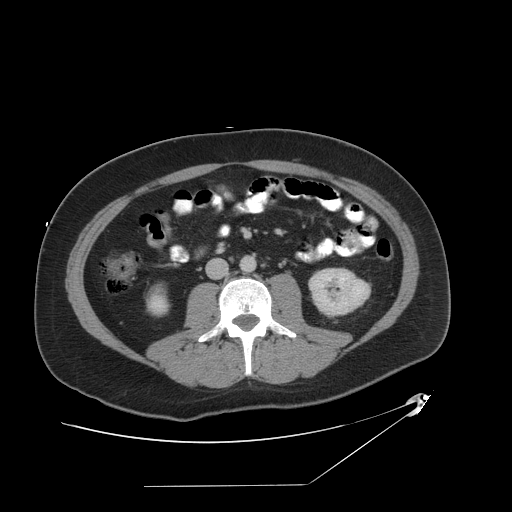
[im 61/109  lung]
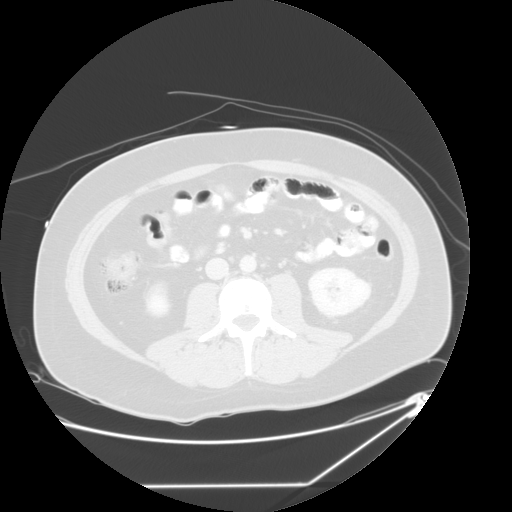
[im 73/109  soft-tissue]
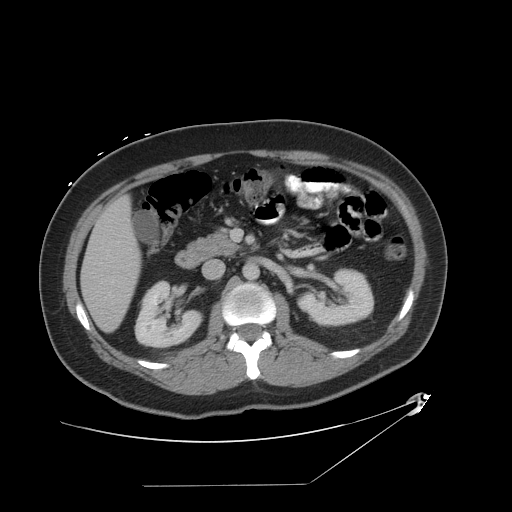
[im 73/109  lung]
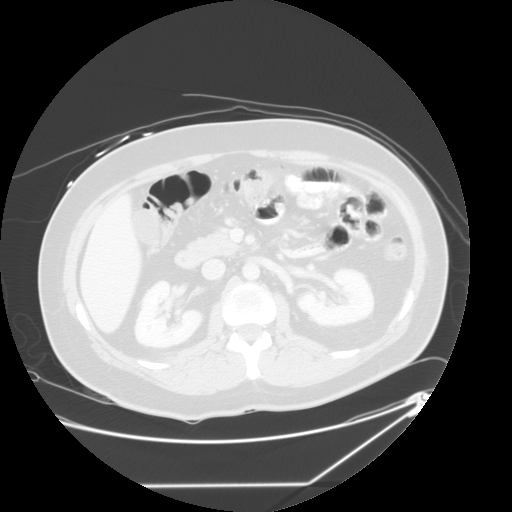
[im 85/109  soft-tissue]
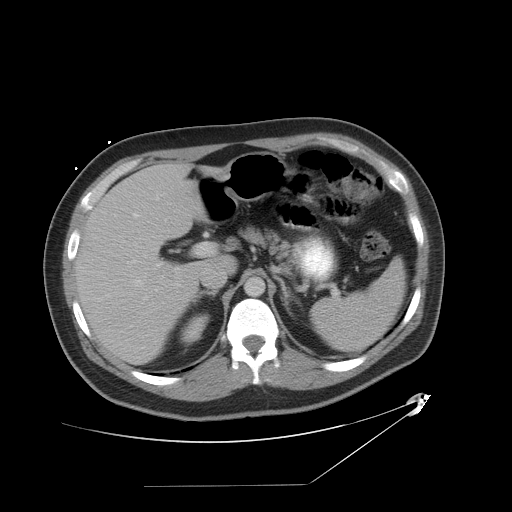
[im 85/109  lung]
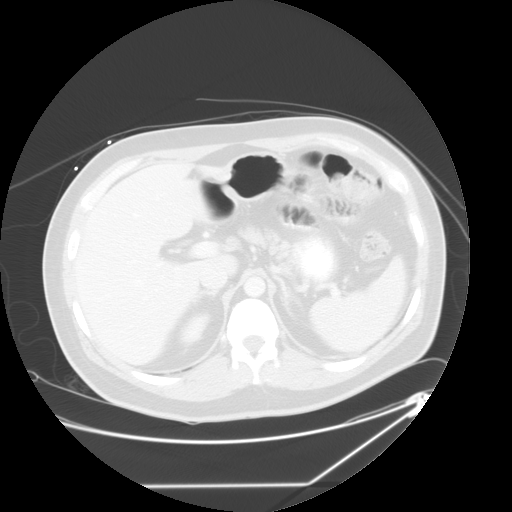
[im 97/109  soft-tissue]
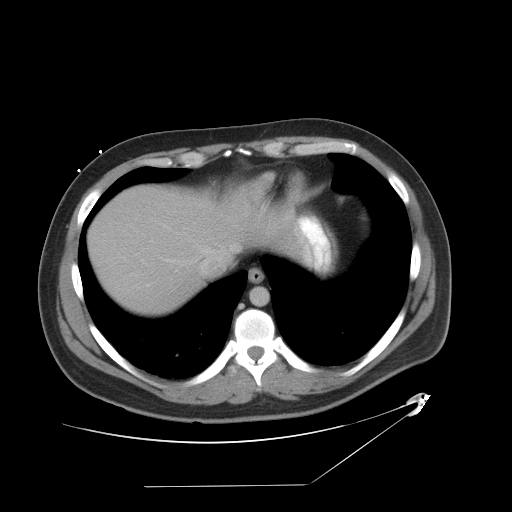
[im 97/109  lung]
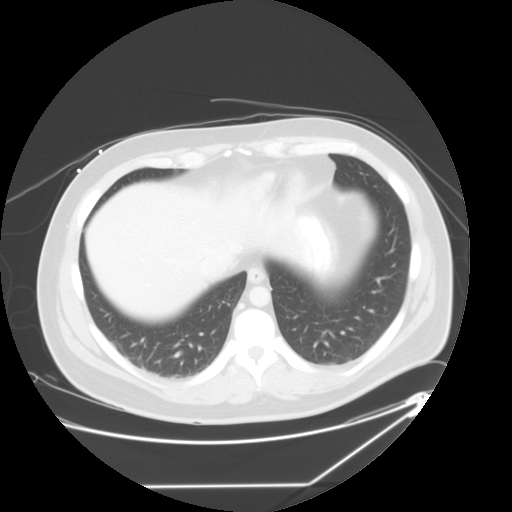

[Series 401: sag · sagittal · 1.04mm/px · 5 of 121 slices shown]
[im 13/121  soft-tissue]
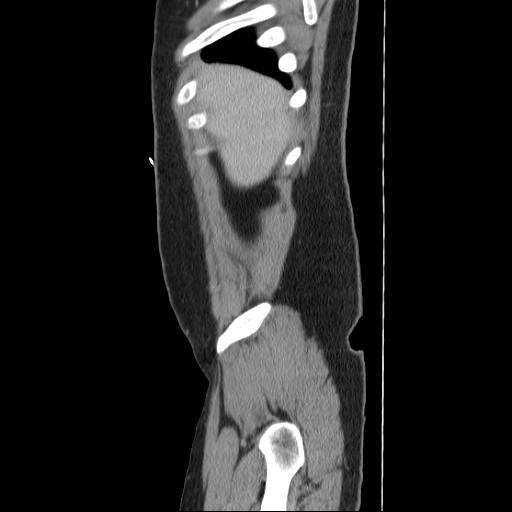
[im 25/121  soft-tissue]
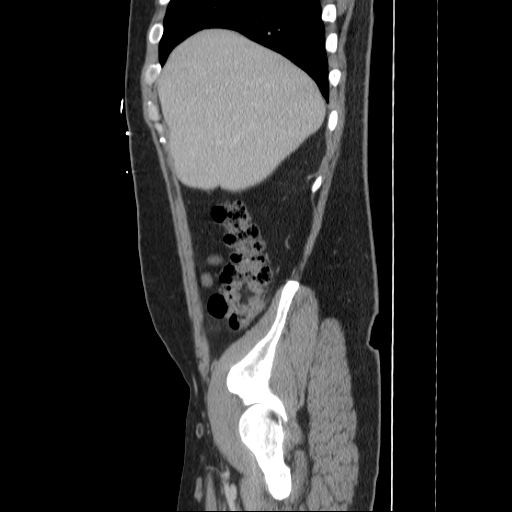
[im 37/121  soft-tissue]
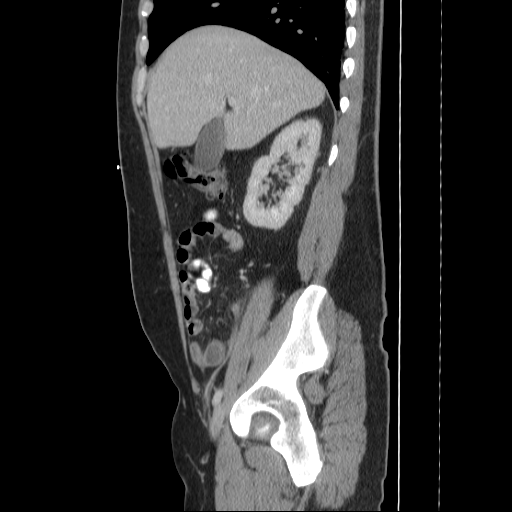
[im 49/121  soft-tissue]
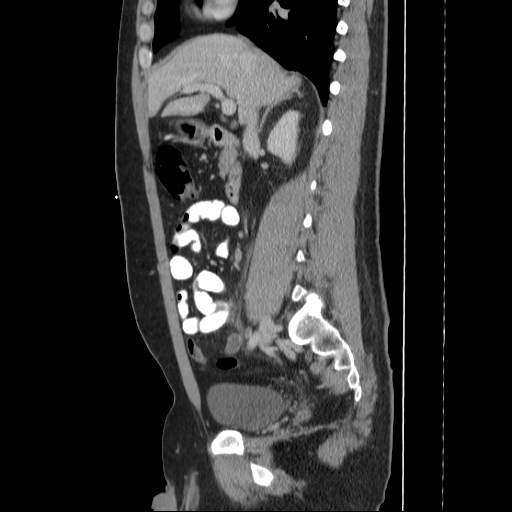
[im 73/121  soft-tissue]
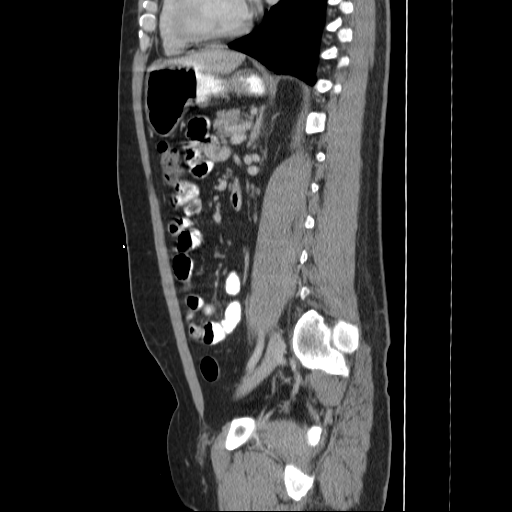

[13 of 32 positions shown; findings below may reference images not displayed]

FINDINGS: Minimal bibasilar atelectasis is noted.

The liver and spleen are unremarkable in appearance.  The
gallbladder is within normal limits.  The pancreas and adrenal
glands are unremarkable.

Nonspecific perinephric stranding is noted bilaterally, mildly
worse on the left side.  There is a 2 mm nonobstructing renal stone
noted near the lower pole of the left kidney.  The kidneys are
otherwise unremarkable in appearance.  There is no evidence of
hydronephrosis.  No obstructing ureteral stones are seen.

No free fluid is identified.  The small bowel is unremarkable in
appearance.  The stomach is filled with contrast and is within
normal limits.  No acute vascular abnormalities are seen.

The appendix is dilated to 1.0 cm in maximal diameter, with
surrounding soft tissue inflammation and trace fluid, compatible
with mild acute appendicitis.  There is no evidence for perforation
or abscess formation.  No significant pericecal lymphadenopathy is
seen.

The colon is largely decompressed and is unremarkable in
appearance.

The bladder is moderately distended and grossly unremarkable in
appearance.  The prostate remains normal in size.  No inguinal
lymphadenopathy is seen.

No acute osseous abnormalities are identified.  Minimal disc space
narrowing is noted at L5-S1.
IMPRESSION: 1.  Mild acute appendicitis, with dilatation of the appendix to
cm in diameter, and surrounding soft tissue inflammation and trace
fluid.  No evidence of perforation or abscess formation.
2.  2 mm nonobstructing stone noted near the lower pole of the left
kidney; nonspecific perinephric stranding noted bilaterally.

Findings were discussed with Dr. Nicolae Serpas at [DATE] a.m. on
09/17/2011.

## 2013-09-06 ENCOUNTER — Ambulatory Visit: Payer: Commercial Managed Care - PPO

## 2014-04-01 ENCOUNTER — Emergency Department (HOSPITAL_COMMUNITY)
Admission: EM | Admit: 2014-04-01 | Discharge: 2014-04-01 | Disposition: A | Discharge status: Home / Self Care | Payer: Commercial Managed Care - PPO | Attending: Emergency Medicine | Admitting: Emergency Medicine

## 2014-04-01 ENCOUNTER — Encounter (HOSPITAL_COMMUNITY): Payer: Self-pay | Admitting: Emergency Medicine

## 2014-04-01 DIAGNOSIS — Z8639 Personal history of other endocrine, nutritional and metabolic disease: Secondary | ICD-10-CM | POA: Insufficient documentation

## 2014-04-01 DIAGNOSIS — I10 Essential (primary) hypertension: Secondary | ICD-10-CM | POA: Insufficient documentation

## 2014-04-01 DIAGNOSIS — L02419 Cutaneous abscess of limb, unspecified: Secondary | ICD-10-CM | POA: Insufficient documentation

## 2014-04-01 DIAGNOSIS — L0390 Cellulitis, unspecified: Secondary | ICD-10-CM

## 2014-04-01 DIAGNOSIS — L03119 Cellulitis of unspecified part of limb: Principal | ICD-10-CM

## 2014-04-01 DIAGNOSIS — Z8719 Personal history of other diseases of the digestive system: Secondary | ICD-10-CM | POA: Insufficient documentation

## 2014-04-01 DIAGNOSIS — Z862 Personal history of diseases of the blood and blood-forming organs and certain disorders involving the immune mechanism: Secondary | ICD-10-CM | POA: Insufficient documentation

## 2014-04-01 DIAGNOSIS — Z872 Personal history of diseases of the skin and subcutaneous tissue: Secondary | ICD-10-CM | POA: Insufficient documentation

## 2014-04-01 DIAGNOSIS — Z7982 Long term (current) use of aspirin: Secondary | ICD-10-CM | POA: Insufficient documentation

## 2014-04-01 DIAGNOSIS — Z79899 Other long term (current) drug therapy: Secondary | ICD-10-CM | POA: Insufficient documentation

## 2014-04-01 MED ORDER — SULFAMETHOXAZOLE-TMP DS 800-160 MG PO TABS
1.0000 | ORAL_TABLET | Freq: Once | ORAL | Status: AC
  Administered 2014-04-01: 1 via ORAL
  Filled 2014-04-01: qty 1

## 2014-04-01 MED ORDER — SULFAMETHOXAZOLE-TRIMETHOPRIM 800-160 MG PO TABS: 1.0000 | ORAL_TABLET | Freq: Two times a day (BID) | ORAL | Status: DC

## 2014-04-01 MED ORDER — CEPHALEXIN 500 MG PO CAPS: 500.0000 mg | ORAL_CAPSULE | Freq: Four times a day (QID) | ORAL | Status: DC

## 2014-04-01 MED ORDER — CEPHALEXIN 250 MG PO CAPS
500.0000 mg | ORAL_CAPSULE | Freq: Once | ORAL | Status: AC
  Administered 2014-04-01: 500 mg via ORAL
  Filled 2014-04-01: qty 2

## 2014-04-01 MED ORDER — OXYCODONE-ACETAMINOPHEN 5-325 MG PO TABS
1.0000 | ORAL_TABLET | Freq: Once | ORAL | Status: AC
  Administered 2014-04-01: 1 via ORAL
  Filled 2014-04-01: qty 1

## 2014-04-01 NOTE — Discharge Instructions (Signed)
Cellulitis °Cellulitis is an infection of the skin and the tissue under the skin. The infected area is usually red and tender. This happens most often in the arms and lower legs. °HOME CARE  °· Take your antibiotic medicine as told. Finish the medicine even if you start to feel better. °· Keep the infected arm or leg raised (elevated). °· Put a warm cloth on the area up to 4 times per day. °· Only take medicines as told by your doctor. °· Keep all doctor visits as told. °GET HELP RIGHT AWAY IF:  °· You have a fever. °· You feel very sleepy. °· You throw up (vomit) or have watery poop (diarrhea). °· You feel sick and have muscle aches and pains. °· You see red streaks on the skin coming from the infected area. °· Your red area gets bigger or turns a dark color. °· Your bone or joint under the infected area is painful after the skin heals. °· Your infection comes back in the same area or different area. °· You have a puffy (swollen) bump in the infected area. °· You have new symptoms. °MAKE SURE YOU:  °· Understand these instructions. °· Will watch your condition. °· Will get help right away if you are not doing well or get worse. °Document Released: 04/08/2008 Document Revised: 04/21/2012 Document Reviewed: 01/06/2012 °[employer]® Patient Information ©2014 [employer]. ° °

## 2014-04-01 NOTE — ED Notes (Signed)
Pt A&Ox4, ambulatory at discharge with slow, steady gait,  NAD. 

## 2014-04-01 NOTE — ED Notes (Addendum)
Pt had tattoo placed to right mid thigh (anterior aspect) last week. On exam pt with redness and swelling to right thigh where tattoo was placed. No fevers.

## 2014-04-01 NOTE — ED Provider Notes (Signed)
CSN: [id number]     Arrival date & time 04/01/14  1429 History   First MD Initiated Contact with Patient 04/01/14 1920     Chief Complaint  Patient presents with  . Wound Infection    tattoo     Patient is a 38 y.o. male with no reported past medical history other than hypertension who presents with complaints of redness and pain to his right anterior thigh. Patient reports approximately one week ago he had a tattoo placed to his right anterior thigh. Over the last couple of days he noticed redness and pain surrounding the tattoo both of which have worsened. He denies fevers, chills, nausea, vomiting, or pain with range of motion of his lower extremities.    (Consider location/radiation/quality/duration/timing/severity/associated sxs/prior Treatment) Patient is a 38 y.o. male presenting with leg pain. The history is provided by the patient and medical records. No language interpreter was used.  Leg Pain Location:  Leg Injury: no   Leg location:  R leg Pain details:    Quality:  Burning   Radiates to:  Does not radiate   Severity:  Mild   Onset quality:  Gradual   Timing:  Constant   Progression:  Worsening Chronicity:  New Dislocation: no   Foreign body present:  No foreign bodies Tetanus status:  Up to date Relieved by:  Nothing Ineffective treatments:  Rest, ice and elevation Associated symptoms: no back pain, no fatigue, no itching, no muscle weakness, no numbness, no swelling and no tingling   Risk factors: no frequent fractures, no known bone disorder and no recent illness     Past Medical History  Diagnosis Date  . Irritable bowel syndrome (IBS)   . Hypertension   . Acne   . Irritable bowel syndrome (IBS)   . Hyperlipidemia    Past Surgical History  Procedure Laterality Date  . Appendectomy  09/17/2011  . Laparoscopic appendectomy  09/17/2011    Procedure: APPENDECTOMY LAPAROSCOPIC;  Surgeon: Shelly Rubensteinouglas A Blackman, MD;  [facility];  Service: General;   Laterality: N/A;   History reviewed. No pertinent family history. History  Substance Use Topics  . Smoking status: Never Smoker   . Smokeless tobacco: Never Used  . Alcohol Use: Yes     Comment: OCCASIONAL    Review of Systems  Constitutional: Negative for fatigue.  Musculoskeletal: Negative for back pain.  Skin: Negative for itching.  All other systems reviewed and are negative.     Allergies  Lactose intolerance (gi)  Home Medications   Prior to Admission medications   Medication Sig Start Date End Date Taking? Authorizing Provider  aspirin EC 81 MG tablet Take 81 mg by mouth daily as needed. For pain     Historical Provider, MD  doxycycline (VIBRA-TABS) 100 MG tablet Take 100 mg by mouth daily.      Historical Provider, MD  lisinopril (PRINIVIL,ZESTRIL) 20 MG tablet Take 20 mg by mouth daily.      Historical Provider, MD   BP 144/94  Pulse 81  Temp(Src) 98.5 F (36.9 C) (Oral)  Resp 16  SpO2 100% Physical Exam  Nursing note and vitals reviewed. Constitutional: He is oriented to person, place, and time. He appears well-developed and well-nourished.  HENT:  Head: Normocephalic and atraumatic.  Right Ear: External ear normal.  Left Ear: External ear normal.  Nose: Nose normal.  Eyes: Conjunctivae are normal. Pupils are equal, round, and reactive to light.  Neck: Normal range of motion. Neck supple.  Cardiovascular:  Normal rate, regular rhythm, normal heart sounds and intact distal pulses.   During my exam HR less than 100.    Pulmonary/Chest: Effort normal and breath sounds normal.  Abdominal: Soft. Bowel sounds are normal.  Musculoskeletal: Normal range of motion. He exhibits no edema and no tenderness.  Neurological: He is alert and oriented to person, place, and time.  Skin:     Psychiatric: He has a normal mood and affect.    ED Course  Procedures (including critical care time) Labs Review Labs Reviewed - No data to display  Imaging Review No  results found.   EKG Interpretation None      MDM   Final diagnoses:  Cellulitis    Patient presents to the ED with complaints of redness and pain to right anterior thigh.  Patient had tattoo placed to thigh appx 7 days ago.  He has no other symptoms.  VS were remarkable for initial HR in the 110s but without intervention HR noted to be less than 100 during my exam and while in the ED.  PE as above and c/w cellulitis to right anterior thigh.  No other abnormalities present on PE.  Given patient is not diabetic, no systemic signs of illness, and no concerning findings on exam other than cellulitis it was felt that discharge with keflex, bactrim, and follow up instructions was appropriate.  Area of erythema marked prior to discharge.      Johnney Ou, MD 04/02/14 [phone]

## 2014-04-01 NOTE — ED Notes (Signed)
Dr. Kunz at bedside.  

## 2014-04-02 NOTE — ED Provider Notes (Signed)
I saw and evaluated the patient, reviewed the resident's note and I agree with the findings and plan.   EKG Interpretation None       Erythema around tattoo site, possible skin reaction vs cellulitis. Treat with antibiotics and given return precautions.   Audree Camel, MD 04/02/14 [phone]

## 2017-12-31 ENCOUNTER — Ambulatory Visit: Payer: BLUE CROSS/BLUE SHIELD | Admitting: Physician Assistant

## 2017-12-31 ENCOUNTER — Encounter: Payer: Self-pay | Admitting: Physician Assistant

## 2017-12-31 VITALS — BP 130/82 | HR 91 | Temp 99.2°F | Ht 72.0 in | Wt 240.2 lb

## 2017-12-31 DIAGNOSIS — K582 Mixed irritable bowel syndrome: Secondary | ICD-10-CM | POA: Diagnosis not present

## 2017-12-31 DIAGNOSIS — F339 Major depressive disorder, recurrent, unspecified: Secondary | ICD-10-CM | POA: Diagnosis not present

## 2017-12-31 DIAGNOSIS — I10 Essential (primary) hypertension: Secondary | ICD-10-CM | POA: Diagnosis not present

## 2017-12-31 MED ORDER — CITALOPRAM HYDROBROMIDE 20 MG PO TABS: 20.0000 mg | ORAL_TABLET | Freq: Every day | ORAL | 1 refills | Status: DC

## 2017-12-31 MED ORDER — LISINOPRIL 40 MG PO TABS: 40.0000 mg | ORAL_TABLET | Freq: Every day | ORAL | 3 refills | Status: DC

## 2017-12-31 NOTE — Patient Instructions (Signed)
In a few days you may receive a survey in the mail or online from [employer] regarding your visit with us today. Please take a moment to fill this out. Your feedback is very important to our whole office. It can help us better understand your needs as well as improve your experience and satisfaction. Thank you for taking your time to complete it. We care about you.  Yarelie Hams, PA-C  

## 2018-01-01 DIAGNOSIS — K582 Mixed irritable bowel syndrome: Secondary | ICD-10-CM | POA: Insufficient documentation

## 2018-01-01 DIAGNOSIS — I10 Essential (primary) hypertension: Secondary | ICD-10-CM | POA: Insufficient documentation

## 2018-01-01 DIAGNOSIS — F339 Major depressive disorder, recurrent, unspecified: Secondary | ICD-10-CM | POA: Insufficient documentation

## 2018-01-01 NOTE — Progress Notes (Signed)
BP 130/82   Pulse 91   Temp 99.2 F (37.3 C) (Oral)   Ht 6' (1.829 m)   Wt 240 lb 3.2 oz (109 kg)   BMI 32.58 kg/m    Subjective:    Patient ID: Dakota Harris, male    DOB: Aug 31, 1976, 42 y.o.   MRN: [id number]  HPI: Aadan Chenier is a 42 y.o. male presenting on 12/31/2017 for New Patient (Initial Visit) and Establish Care Patient is new to come in today to establish.  He has a family history of depression and anxiety.  He has tried to work on decreasing his alcohol intake and has some very good job.  However he is very depressed and has a PHQ score of 20.  His past medical history is positive for lactose intolerance, IBS and has had slightly elevated blood pressure at times.   Past Medical History:  Diagnosis Date  . Acne   . Hyperlipidemia   . Hypertension   . Irritable bowel syndrome (IBS)   . Irritable bowel syndrome (IBS)    Relevant past medical, surgical, family and social history reviewed and updated as indicated. Interim medical history since our last visit reviewed. Allergies and medications reviewed and updated. DATA REVIEWED: CHART IN EPIC  Family History reviewed for pertinent findings.  Review of Systems  Constitutional: Negative.  Negative for appetite change and fatigue.  HENT: Negative.   Eyes: Negative.  Negative for pain and visual disturbance.  Respiratory: Negative.  Negative for cough, chest tightness, shortness of breath and wheezing.   Cardiovascular: Negative.  Negative for chest pain, palpitations and leg swelling.  Gastrointestinal: Negative.  Negative for abdominal pain, diarrhea, nausea and vomiting.  Endocrine: Negative.   Genitourinary: Negative.   Musculoskeletal: Negative.   Skin: Negative.  Negative for color change and rash.  Neurological: Negative.  Negative for weakness, numbness and headaches.  Psychiatric/Behavioral: Positive for dysphoric mood. The patient is nervous/anxious.     Allergies as of 12/31/2017    Reactions   Lactose Intolerance (gi) Other (See Comments)   GI Upset      Medication List        Accurate as of 12/31/17 11:59 PM. Always use your most recent med list.          aspirin EC 81 MG tablet Take 81 mg by mouth daily as needed. For pain   citalopram 20 MG tablet Commonly known as:  CELEXA Take 1 tablet (20 mg total) by mouth daily.   lisinopril 40 MG tablet Commonly known as:  PRINIVIL,ZESTRIL Take 1 tablet (40 mg total) by mouth daily.          Objective:    BP 130/82   Pulse 91   Temp 99.2 F (37.3 C) (Oral)   Ht 6' (1.829 m)   Wt 240 lb 3.2 oz (109 kg)   BMI 32.58 kg/m   Allergies  Allergen Reactions  . Lactose Intolerance (Gi) Other (See Comments)    GI Upset    Wt Readings from Last 3 Encounters:  12/31/17 240 lb 3.2 oz (109 kg)  10/08/11 223 lb 3.2 oz (101.2 kg)    Physical Exam  Constitutional: He appears well-developed and well-nourished. No distress.  HENT:  Head: Normocephalic and atraumatic.  Eyes: Conjunctivae and EOM are normal. Pupils are equal, round, and reactive to light.  Cardiovascular: Normal rate, regular rhythm and normal heart sounds.  Pulmonary/Chest: Effort normal and breath sounds normal. No respiratory distress.  Skin: Skin is warm and  dry.  Psychiatric: He has a normal mood and affect. His behavior is normal.  Nursing note and vitals reviewed.       Assessment & Plan:   .1. Essential hypertension - lisinopril (PRINIVIL,ZESTRIL) 40 MG tablet; Take 1 tablet (40 mg total) by mouth daily.  Dispense: 90 tablet; Refill: 3  2. Irritable bowel syndrome with both constipation and diarrhea  3. Depression, recurrent (HCC) - citalopram (CELEXA) 20 MG tablet; Take 1 tablet (20 mg total) by mouth daily.  Dispense: 30 tablet; Refill: 1    Continue all other maintenance medications as listed above.  Follow up plan: Return in about 4 weeks (around 01/28/2018) for recheck.  Educational handout given for survey  Remus LofflerAngel  S. Lidya Mccalister PA-C [facility] [address]  [place], [place] [address] [phone]   01/01/2018, 1:33 PM

## 2018-01-28 ENCOUNTER — Ambulatory Visit: Payer: BLUE CROSS/BLUE SHIELD | Admitting: Physician Assistant

## 2018-02-27 ENCOUNTER — Other Ambulatory Visit: Payer: Self-pay | Admitting: Physician Assistant

## 2018-02-27 DIAGNOSIS — F339 Major depressive disorder, recurrent, unspecified: Secondary | ICD-10-CM

## 2018-03-26 ENCOUNTER — Other Ambulatory Visit: Payer: Self-pay | Admitting: Physician Assistant

## 2018-03-26 DIAGNOSIS — F339 Major depressive disorder, recurrent, unspecified: Secondary | ICD-10-CM

## 2018-05-01 ENCOUNTER — Other Ambulatory Visit: Payer: Self-pay | Admitting: Physician Assistant

## 2018-05-01 DIAGNOSIS — F339 Major depressive disorder, recurrent, unspecified: Secondary | ICD-10-CM

## 2018-05-01 NOTE — Telephone Encounter (Signed)
Last seen 12/31/17

## 2018-05-29 ENCOUNTER — Other Ambulatory Visit: Payer: Self-pay | Admitting: Physician Assistant

## 2018-05-29 DIAGNOSIS — F339 Major depressive disorder, recurrent, unspecified: Secondary | ICD-10-CM

## 2018-05-29 NOTE — Telephone Encounter (Signed)
[facility] 12[facility]31/17  [facility]

## 2018-08-31 ENCOUNTER — Other Ambulatory Visit: Payer: Self-pay | Admitting: Physician Assistant

## 2018-08-31 DIAGNOSIS — F339 Major depressive disorder, recurrent, unspecified: Secondary | ICD-10-CM

## 2018-08-31 NOTE — Telephone Encounter (Signed)
Last seen 12/31/17  Dakota Harris 

## 2019-01-06 ENCOUNTER — Other Ambulatory Visit: Payer: Self-pay | Admitting: Physician Assistant

## 2019-01-06 DIAGNOSIS — I10 Essential (primary) hypertension: Secondary | ICD-10-CM

## 2019-01-07 NOTE — Telephone Encounter (Signed)
Na-cb 3/5 

## 2019-01-07 NOTE — Telephone Encounter (Signed)
Last seen 12/31/17  Dakota Harris  Needs to be seen

## 2019-01-20 ENCOUNTER — Telehealth: Payer: Self-pay | Admitting: Physician Assistant

## 2019-01-20 NOTE — Telephone Encounter (Signed)
Aware,  Should continue with medication.

## 2019-01-25 NOTE — Telephone Encounter (Signed)
Multiple attempts made to contact patient.  He will need to be seen for refills

## 2019-01-28 ENCOUNTER — Telehealth (INDEPENDENT_AMBULATORY_CARE_PROVIDER_SITE_OTHER): Payer: Self-pay | Admitting: Physician Assistant

## 2019-01-28 ENCOUNTER — Other Ambulatory Visit: Payer: Self-pay

## 2019-01-28 DIAGNOSIS — I10 Essential (primary) hypertension: Secondary | ICD-10-CM

## 2019-01-28 MED ORDER — LISINOPRIL 40 MG PO TABS: 40.0000 mg | ORAL_TABLET | Freq: Every day | ORAL | 1 refills | Status: DC

## 2019-01-28 NOTE — Progress Notes (Signed)
Telephone visit  Subjective: CC:htn and depression PCP: Remus Loffler, PA-C [id number] Dakota Harris is a 43 y.o. male calls for telephone consult today. Patient provides verbal consent for consult held via phone.  Location of patient: home Location of provider: [facility] Others present for call: no   This is a check for his hypertension and resolved depression.  He has still been taking his lisinopril and reports that his blood pressure readings have been good at home.  Last fall he was under a lot of stress and a very dissatisfying job.  He was able to get out from that job and is feeling immensely better.  Therefore he has tapered off of the Celexa and does not feel that he needs it anymore at this time.  Otherwise he is not having any difficulty with chest pain, shortness of breath.  No excessive fatigue.  I have encouraged him to call back and get an appointment for 6 months from now for [place] to see him in the office.  ROS: Per HPI  Allergies  Allergen Reactions  . Lactose Intolerance (Gi) Other (See Comments)    GI Upset   Past Medical History:  Diagnosis Date  . Acne   . Hyperlipidemia   . Hypertension   . Irritable bowel syndrome (IBS)   . Irritable bowel syndrome (IBS)     Current Outpatient Medications:  .  aspirin EC 81 MG tablet, Take 81 mg by mouth daily as needed. For pain , Disp: , Rfl:  .  citalopram (CELEXA) 20 MG tablet, TAKE 1 TABLET BY MOUTH EVERY DAY, Disp: 90 tablet, Rfl: 0 .  lisinopril (PRINIVIL,ZESTRIL) 40 MG tablet, Take 1 tablet (40 mg total) by mouth daily., Disp: 90 tablet, Rfl: 3  Assessment/ Plan: 43 y.o. male   1. Essential hypertension - lisinopril (PRINIVIL,ZESTRIL) 40 MG tablet; Take 1 tablet (40 mg total) by mouth daily.  Dispense: 90 tablet; Refill: 1    Start time: 9:12 End time: 9:18  No orders of the defined types were placed in this encounter.   Prudy Feeler PA-C [facility] [phone]

## 2019-04-20 ENCOUNTER — Telehealth: Payer: Self-pay | Admitting: Physician Assistant

## 2019-04-22 ENCOUNTER — Encounter: Payer: Self-pay | Admitting: Physician Assistant

## 2019-04-22 ENCOUNTER — Other Ambulatory Visit: Payer: Self-pay

## 2019-04-22 ENCOUNTER — Ambulatory Visit (INDEPENDENT_AMBULATORY_CARE_PROVIDER_SITE_OTHER): Payer: Self-pay | Admitting: Physician Assistant

## 2019-04-22 DIAGNOSIS — F339 Major depressive disorder, recurrent, unspecified: Secondary | ICD-10-CM

## 2019-04-22 MED ORDER — CITALOPRAM HYDROBROMIDE 20 MG PO TABS: 20.0000 mg | ORAL_TABLET | Freq: Every day | ORAL | 5 refills | Status: DC

## 2019-04-22 NOTE — Progress Notes (Signed)
     Telephone visit  Subjective: IR:JJOACZYSAY PCP: Terald Sleeper, PA-C TKZ:[id number] Conley-Biggs is a 43 y.o. male calls for telephone consult today. Patient provides verbal consent for consult held via phone.  Patient is identified with 2 separate identifiers.  At this time the entire area is on COVID-19 social distancing and stay home orders are in place.  Patient is of higher risk and therefore we are performing this by a virtual method.  Location of patient: Home Location of provider: [facility] Others present for call: No  The patient calls having significant symptoms and worry overall look the COVID induced issues.  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  It was a great life improvement for him.  The retail has become extremely stressful.  But however due to COVID-19 he has had the loss of both of his jobs and will not be going back to them per management.  He states he is financially okay because of the unemployment payments however he is just worried about future things and how and where he will work.  He has taken Celexa in the past.  And would like to restart it.  He is his most significant symptoms are interrupted sleep, bad dreams, low motivation, poor concentration, feeling failure, and sadness   ROS: Per HPI  Allergies  Allergen Reactions  . Lactose Intolerance (Gi) Other (See Comments)    GI Upset   Past Medical History:  Diagnosis Date  . Acne   . Hyperlipidemia   . Hypertension   . Irritable bowel syndrome (IBS)   . Irritable bowel syndrome (IBS)     Current Outpatient Medications:  .  aspirin EC 81 MG tablet, Take 81 mg by mouth daily as needed. For pain , Disp: , Rfl:  .  citalopram (CELEXA) 20 MG tablet, Take 1 tablet (20 mg total) by mouth daily., Disp: 30 tablet, Rfl: 5 .  lisinopril (PRINIVIL,ZESTRIL) 40 MG tablet, Take 1 tablet (40 mg total) by mouth  daily., Disp: 90 tablet, Rfl: 1  Assessment/ Plan: 43 y.o. male   1. Depression, recurrent ([place]) - citalopram (CELEXA) 20 MG tablet; Take 1 tablet (20 mg total) by mouth daily.  Dispense: 30 tablet; Refill: 5   Continue all other maintenance medications as listed above.  Start time: 8:03 AM End time: 8:10 AM  Meds ordered this encounter  Medications  . citalopram (CELEXA) 20 MG tablet    Sig: Take 1 tablet (20 mg total) by mouth daily.    Dispense:  30 tablet    Refill:  5    Order Specific Question:   Supervising Provider    Answer:   Janora Norlander [[id number]]    Particia Nearing PA-C [place] [phone]

## 2019-07-21 ENCOUNTER — Other Ambulatory Visit: Payer: Self-pay

## 2019-07-21 ENCOUNTER — Ambulatory Visit (INDEPENDENT_AMBULATORY_CARE_PROVIDER_SITE_OTHER): Payer: Self-pay | Admitting: Physician Assistant

## 2019-07-21 ENCOUNTER — Encounter: Payer: Self-pay | Admitting: Physician Assistant

## 2019-07-21 DIAGNOSIS — R519 Headache, unspecified: Secondary | ICD-10-CM

## 2019-07-21 DIAGNOSIS — H5710 Ocular pain, unspecified eye: Secondary | ICD-10-CM

## 2019-07-21 DIAGNOSIS — L03211 Cellulitis of face: Secondary | ICD-10-CM

## 2019-07-21 DIAGNOSIS — R51 Headache: Secondary | ICD-10-CM

## 2019-07-21 MED ORDER — CEPHALEXIN 500 MG PO CAPS: 500.0000 mg | ORAL_CAPSULE | Freq: Four times a day (QID) | ORAL | 0 refills | Status: DC

## 2019-07-21 NOTE — Progress Notes (Signed)
     Telephone visit  Subjective: OI:ZTIW pain, eye pain PCP: Terald Sleeper, PA-C PYK:[id number] Dakota Harris is a 43 y.o. male calls for telephone consult today. Patient provides verbal consent for consult held via phone.  Patient is identified with 2 separate identifiers.  At this time the entire area is on COVID-19 social distancing and stay home orders are in place.  Patient is of higher risk and therefore we are performing this by a virtual method.  Location of patient: home Location of provider: HOME Others present for call: no  This patient has had a recurrence of an infection he gets or so.  Years ago he cannot help with symptoms removed and he is always had a little bit of trouble with pain in that area.  And he will have increased pain in the mid cheek all cervical area and even sometimes around.  At this time he is having some headache and pain.  He denies having any fever or chills.   ROS: Per HPI  Allergies  Allergen Reactions  . Lactose Intolerance (Gi) Other (See Comments)    GI Upset   Past Medical History:  Diagnosis Date  . Acne   . Hyperlipidemia   . Hypertension   . Irritable bowel syndrome (IBS)   . Irritable bowel syndrome (IBS)     Current Outpatient Medications:  .  aspirin EC 81 MG tablet, Take 81 mg by mouth daily as needed. For pain , Disp: , Rfl:  .  cephALEXin (KEFLEX) 500 MG capsule, Take 1 capsule (500 mg total) by mouth 4 (four) times daily., Disp: 40 capsule, Rfl: 0 .  citalopram (CELEXA) 20 MG tablet, Take 1 tablet (20 mg total) by mouth daily., Disp: 30 tablet, Rfl: 5 .  lisinopril (PRINIVIL,ZESTRIL) 40 MG tablet, Take 1 tablet (40 mg total) by mouth daily., Disp: 90 tablet, Rfl: 1  Assessment/ Plan: 43 y.o. male   1. Pain in eye, unspecified laterality - cephALEXin (KEFLEX) 500 MG capsule; Take 1 capsule (500 mg total) by mouth 4 (four) times daily.  Dispense: 40 capsule; Refill: 0  2. Nonintractable headache, unspecified chronicity  pattern, unspecified headache type - cephALEXin (KEFLEX) 500 MG capsule; Take 1 capsule (500 mg total) by mouth 4 (four) times daily.  Dispense: 40 capsule; Refill: 0  3. Cellulitis of face - cephALEXin (KEFLEX) 500 MG capsule; Take 1 capsule (500 mg total) by mouth 4 (four) times daily.  Dispense: 40 capsule; Refill: 0    No follow-ups on file.  Continue all other maintenance medications as listed above.  Start time: 3:50 PM End time: 4:02 PM  Meds ordered this encounter  Medications  . cephALEXin (KEFLEX) 500 MG capsule    Sig: Take 1 capsule (500 mg total) by mouth 4 (four) times daily.    Dispense:  40 capsule    Refill:  0    Order Specific Question:   Supervising Provider    Answer:   Janora Norlander [[id number]]    Particia Nearing PA-C [place] [phone]

## 2019-07-23 ENCOUNTER — Other Ambulatory Visit: Payer: Self-pay | Admitting: Physician Assistant

## 2019-07-23 DIAGNOSIS — I10 Essential (primary) hypertension: Secondary | ICD-10-CM

## 2019-07-27 ENCOUNTER — Telehealth: Payer: Self-pay | Admitting: Physician Assistant

## 2019-07-27 NOTE — Telephone Encounter (Signed)
Pharmacy updated in chart

## 2019-08-05 ENCOUNTER — Telehealth: Payer: Self-pay | Admitting: Physician Assistant

## 2019-08-05 DIAGNOSIS — I10 Essential (primary) hypertension: Secondary | ICD-10-CM

## 2019-08-05 MED ORDER — LISINOPRIL 40 MG PO TABS: 40.0000 mg | ORAL_TABLET | Freq: Every day | ORAL | 1 refills | Status: DC

## 2019-08-05 NOTE — Telephone Encounter (Signed)
Left message stating refill has been [facility] to [facility] and to call back with any questions or concerns

## 2019-08-25 ENCOUNTER — Encounter: Payer: Self-pay | Admitting: Physician Assistant

## 2019-08-25 ENCOUNTER — Ambulatory Visit (INDEPENDENT_AMBULATORY_CARE_PROVIDER_SITE_OTHER): Payer: Self-pay | Admitting: Physician Assistant

## 2019-08-25 DIAGNOSIS — I10 Essential (primary) hypertension: Secondary | ICD-10-CM

## 2019-08-25 DIAGNOSIS — F339 Major depressive disorder, recurrent, unspecified: Secondary | ICD-10-CM

## 2019-08-25 MED ORDER — LISINOPRIL 40 MG PO TABS: 40.0000 mg | ORAL_TABLET | Freq: Every day | ORAL | 1 refills | Status: DC

## 2019-08-25 MED ORDER — CITALOPRAM HYDROBROMIDE 20 MG PO TABS: 20.0000 mg | ORAL_TABLET | Freq: Every day | ORAL | 5 refills | Status: DC

## 2019-08-25 NOTE — Progress Notes (Signed)
820  No headache      Telephone visit  Subjective: IO:NGEXBMW medications PCP: Terald Sleeper, PA-C UXL:[id number] Dakota Harris is a 43 y.o. male calls for telephone consult today. Patient provides verbal consent for consult held via phone.  Patient is identified with 2 separate identifiers.  At this time the entire area is on COVID-19 social distancing and stay home orders are in place.  Patient is of higher risk and therefore we are performing this by a virtual method.  Location of patient: home Location of provider: HOME Others present for call: no  This patient is have a recheck for his chronic medical conditions which do include depression and hypertension.  He has had great readings on his blood pressure.  He denies any chest pain.  He is riding a bike very regularly.  He lives in [place] and does not have an automobile.  He uses the [place] frequently.  He states that overall the stress is doing okay no significant setbacks even with everything that is going on.  He is working in [employer].  He has no other complaints at this time.   ROS: Per HPI  Allergies  Allergen Reactions  . Lactose Intolerance (Gi) Other (See Comments)    GI Upset   Past Medical History:  Diagnosis Date  . Acne   . Hyperlipidemia   . Hypertension   . Irritable bowel syndrome (IBS)   . Irritable bowel syndrome (IBS)     Current Outpatient Medications:  .  aspirin EC 81 MG tablet, Take 81 mg by mouth daily as needed. For pain , Disp: , Rfl:  .  citalopram (CELEXA) 20 MG tablet, Take 1 tablet (20 mg total) by mouth daily., Disp: 30 tablet, Rfl: 5 .  lisinopril (ZESTRIL) 40 MG tablet, Take 1 tablet (40 mg total) by mouth daily., Disp: 90 tablet, Rfl: 1  Assessment/ Plan: 43 y.o. male   1. Depression, recurrent (HCC) Continue Celexa 20 mg 1 daily  2. Essential hypertension Continue lisinopril 40 mg 1 daily   No follow-ups on file.  Continue all other maintenance medications  as listed above.  Start time: 8:25 AM End time: 8:34 AM  No orders of the defined types were placed in this encounter.   Particia Nearing PA-C [place] [phone]

## 2019-12-30 ENCOUNTER — Ambulatory Visit (INDEPENDENT_AMBULATORY_CARE_PROVIDER_SITE_OTHER): Payer: Self-pay | Admitting: Family Medicine

## 2019-12-30 ENCOUNTER — Encounter: Payer: Self-pay | Admitting: Family Medicine

## 2019-12-30 DIAGNOSIS — R21 Rash and other nonspecific skin eruption: Secondary | ICD-10-CM

## 2019-12-30 MED ORDER — TRIAMCINOLONE ACETONIDE 0.1 % EX CREA: 1.0000 "application " | TOPICAL_CREAM | Freq: Two times a day (BID) | CUTANEOUS | 0 refills | Status: AC

## 2019-12-30 NOTE — Progress Notes (Signed)
   Virtual Visit via Telephone Note  I connected with Dakota Harris on 12/30/19 at 2:26 PM by telephone and verified that I am speaking with the correct person using two identifiers. Dakota Harris is currently located at home and nobody is currently with him during this visit. The provider, Gwenlyn Fudge, FNP is located in their office at time of visit.  I discussed the limitations, risks, security and privacy concerns of performing an evaluation and management service by telephone and the availability of in person appointments. I also discussed with the patient that there may be a patient responsible charge related to this service. The patient expressed understanding and agreed to proceed.  Subjective: PCP: Remus Loffler, PA-C  Chief Complaint  Patient presents with  . Rash    Neck and arms   Patient reports new onset of a rash at the base of his neck around his clavicles out to his shoulders and down his left arm.  The rash was this morning.  He denies any itching except in the bend of his elbow where it is worse.  He describes the rash as splotchy with raised bumps.  He has had no known new exposures including soaps, detergent, clothing, chemicals, laundry detergent, or fabric softeners.  Not feel the rash is still spreading.   ROS: Per HPI  Current Outpatient Medications:  .  aspirin EC 81 MG tablet, Take 81 mg by mouth daily as needed. For pain , Disp: , Rfl:  .  citalopram (CELEXA) 20 MG tablet, Take 1 tablet (20 mg total) by mouth daily., Disp: 30 tablet, Rfl: 5 .  lisinopril (ZESTRIL) 40 MG tablet, Take 1 tablet (40 mg total) by mouth daily., Disp: 90 tablet, Rfl: 1  Allergies  Allergen Reactions  . Lactose Intolerance (Gi) Other (See Comments)    GI Upset   Past Medical History:  Diagnosis Date  . Acne   . Hyperlipidemia   . Hypertension   . Irritable bowel syndrome (IBS)   . Irritable bowel syndrome (IBS)     Observations/Objective: A&O  No  respiratory distress or wheezing audible over the phone Mood, judgement, and thought processes all WNL  Assessment and Plan: 1. Rash - Encouraged apply triamcinolone twice daily and start taking oral antihistamine daily while he has the rash. - triamcinolone cream (KENALOG) 0.1 %; Apply 1 application topically 2 (two) times daily.  Dispense: 80 g; Refill: 0   Follow Up Instructions:  I discussed the assessment and treatment plan with the patient. The patient was provided an opportunity to ask questions and all were answered. The patient agreed with the plan and demonstrated an understanding of the instructions.   The patient was advised to call back or seek an in-person evaluation if the symptoms worsen or if the condition fails to improve as anticipated.  The above assessment and management plan was discussed with the patient. The patient verbalized understanding of and has agreed to the management plan. Patient is aware to call the clinic if symptoms persist or worsen. Patient is aware when to return to the clinic for a follow-up visit. Patient educated on when it is appropriate to go to the emergency department.   Time call ended: 2:34 PM  I provided 10 minutes of non-face-to-face time during this encounter.  Deliah Boston, MSN, APRN, FNP-C [facility] 12/30/19

## 2020-02-16 ENCOUNTER — Ambulatory Visit: Payer: Commercial Managed Care - PPO | Attending: Internal Medicine

## 2020-02-16 DIAGNOSIS — Z20822 Contact with and (suspected) exposure to covid-19: Secondary | ICD-10-CM

## 2020-02-17 LAB — NOVEL CORONAVIRUS, NAA: SARS-CoV-2, NAA: NOT DETECTED

## 2020-02-17 LAB — SARS-COV-2, NAA 2 DAY TAT

## 2020-03-06 ENCOUNTER — Other Ambulatory Visit: Payer: Self-pay | Admitting: *Deleted

## 2020-03-06 DIAGNOSIS — F339 Major depressive disorder, recurrent, unspecified: Secondary | ICD-10-CM

## 2020-03-06 MED ORDER — CITALOPRAM HYDROBROMIDE 20 MG PO TABS: 20.0000 mg | ORAL_TABLET | Freq: Every day | ORAL | 0 refills | Status: AC

## 2020-05-05 ENCOUNTER — Other Ambulatory Visit: Payer: Self-pay

## 2020-05-05 DIAGNOSIS — I10 Essential (primary) hypertension: Secondary | ICD-10-CM

## 2020-05-05 MED ORDER — LISINOPRIL 40 MG PO TABS: 40.0000 mg | ORAL_TABLET | Freq: Every day | ORAL | 0 refills | Status: DC

## 2020-06-12 ENCOUNTER — Other Ambulatory Visit: Payer: Self-pay | Admitting: Family Medicine

## 2020-06-12 DIAGNOSIS — I10 Essential (primary) hypertension: Secondary | ICD-10-CM

## 2020-09-18 ENCOUNTER — Other Ambulatory Visit: Payer: Self-pay | Admitting: *Deleted

## 2020-09-18 DIAGNOSIS — I10 Essential (primary) hypertension: Secondary | ICD-10-CM

## 2020-10-02 ENCOUNTER — Telehealth: Payer: Self-pay

## 2020-10-02 DIAGNOSIS — I10 Essential (primary) hypertension: Secondary | ICD-10-CM

## 2020-10-02 MED ORDER — LISINOPRIL 40 MG PO TABS: 40.0000 mg | ORAL_TABLET | Freq: Every day | ORAL | 0 refills | Status: AC

## 2020-10-02 NOTE — Telephone Encounter (Signed)
  Prescription Request  10/02/2020  What is the name of the medication or equipment? Lisinopril  Have you contacted your pharmacy to request a refill? (if applicable) Yes  Which pharmacy would you like this sent to? [facility], [place]  Pt scheduled appt to see Je on 10/17/20 but needs refill on his medicine to last him until he comes in.   Patient notified that their request is being sent to the clinical staff for review and that they should receive a response within 2 business days.

## 2020-10-02 NOTE — Telephone Encounter (Signed)
Pt aware refill sent to pharmacy 

## 2020-10-17 ENCOUNTER — Ambulatory Visit: Payer: Self-pay | Admitting: Nurse Practitioner

## 2020-10-18 ENCOUNTER — Encounter: Payer: Self-pay | Admitting: Physician Assistant

## 2020-11-06 ENCOUNTER — Other Ambulatory Visit: Payer: Self-pay | Admitting: Family

## 2020-11-06 DIAGNOSIS — I10 Essential (primary) hypertension: Secondary | ICD-10-CM

## 2021-06-07 ENCOUNTER — Ambulatory Visit (HOSPITAL_COMMUNITY)
Admission: EM | Admit: 2021-06-07 | Discharge: 2021-06-07 | Disposition: A | Discharge status: Home / Self Care | Payer: Self-pay | Attending: Family Medicine | Admitting: Family Medicine

## 2021-06-07 ENCOUNTER — Other Ambulatory Visit: Payer: Self-pay

## 2021-06-07 ENCOUNTER — Encounter (HOSPITAL_COMMUNITY): Payer: Self-pay

## 2021-06-07 DIAGNOSIS — L918 Other hypertrophic disorders of the skin: Secondary | ICD-10-CM
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
## 2024-02-10 ENCOUNTER — Emergency Department (HOSPITAL_COMMUNITY)
Admission: EM | Admit: 2024-02-10 | Discharge: 2024-02-10 | Disposition: A | Discharge status: Home / Self Care | Payer: Self-pay | Attending: Emergency Medicine | Admitting: Emergency Medicine

## 2024-02-10 ENCOUNTER — Encounter (HOSPITAL_COMMUNITY): Payer: Self-pay | Admitting: *Deleted

## 2024-02-10 ENCOUNTER — Other Ambulatory Visit: Payer: Self-pay

## 2024-02-10 ENCOUNTER — Emergency Department (HOSPITAL_COMMUNITY): Payer: Self-pay

## 2024-02-10 DIAGNOSIS — N433 Hydrocele, unspecified: Secondary | ICD-10-CM | POA: Insufficient documentation

## 2024-02-10 LAB — URINALYSIS, ROUTINE W REFLEX MICROSCOPIC
Bilirubin Urine: NEGATIVE
Glucose, UA: NEGATIVE mg/dL
Hgb urine dipstick: NEGATIVE
Ketones, ur: NEGATIVE mg/dL
Leukocytes,Ua: NEGATIVE
Nitrite: NEGATIVE
Protein, ur: NEGATIVE mg/dL
Specific Gravity, Urine: 1.019 (ref 1.005–1.030)
pH: 5 (ref 5.0–8.0)

## 2024-02-10 NOTE — ED Provider Notes (Signed)
 [place] EMERGENCY DEPARTMENT AT [facility] Provider Note   CSN: [id number] Arrival date & time: 02/10/24  1433     History  Chief Complaint  Patient presents with   Testicle Pain    X7 weeks    Dakota Harris is a 48 y.o. male here presenting with testicular pain.  Patient states that about 7 weeks ago he started having testicular pain when he was sitting.  He states that he started a new job and rides his bike a lot and noticed worsening testicular pain when he rides his bike.  He states that he has not been sexually active for several years and denies any penile discharge.  The history is provided by the patient.       Home Medications Prior to Admission medications   Medication Sig Start Date End Date Taking? Authorizing Provider  aspirin EC 81 MG tablet Take 81 mg by mouth daily as needed. For pain     [provider]  citalopram (CELEXA) 20 MG tablet Take 1 tablet (20 mg total) by mouth daily. (Needs to be seen before next refill) 03/06/20   Raliegh Ip, DO  lisinopril (ZESTRIL) 40 MG tablet Take 1 tablet (40 mg total) by mouth daily. 10/02/20   Jannifer Rodney A, FNP  triamcinolone cream (KENALOG) 0.1 % Apply 1 application topically 2 (two) times daily. 12/30/19   Gwenlyn Fudge, FNP      Allergies    Lactose intolerance (gi)    Review of Systems   Review of Systems  Genitourinary:  Positive for testicular pain.  All other systems reviewed and are negative.   Physical Exam Updated Vital Signs BP (!) 152/95 (BP Location: Right Arm)   Pulse 95   Temp 97.6 F (36.4 C) (Oral)   Resp 16   SpO2 100%  Physical Exam Vitals and nursing note reviewed.  Constitutional:      Appearance: Normal appearance.  HENT:     Head: Normocephalic.     Nose: Nose normal.     Mouth/Throat:     Mouth: Mucous membranes are moist.  Eyes:     Extraocular Movements: Extraocular movements intact.     Pupils: Pupils are equal, round, and reactive to  light.  Cardiovascular:     Rate and Rhythm: Normal rate.     Pulses: Normal pulses.     Heart sounds: Normal heart sounds.  Pulmonary:     Effort: Pulmonary effort is normal.     Breath sounds: Normal breath sounds.  Abdominal:     General: Abdomen is flat.     Palpations: Abdomen is soft.  Genitourinary:    Comments: Patient has no obvious scrotal swelling.  Normal cremasteric reflex bilaterally.  No obvious scrotal cellulitis. Musculoskeletal:     Cervical back: Normal range of motion.  Neurological:     Mental Status: He is alert.  Psychiatric:        Mood and Affect: Mood normal.        Behavior: Behavior normal.     ED Results / Procedures / Treatments   Labs (all labs ordered are listed, but only abnormal results are displayed) Labs Reviewed  URINALYSIS, ROUTINE W REFLEX MICROSCOPIC    EKG None  Radiology No results found.  Procedures Procedures    Medications Ordered in ED Medications - No data to display  ED Course/ Medical Decision Making/ A&P  Medical Decision Making Dakota Harris is a 48 y.o. male here presenting with scrotal pain.  Patient has testicular pain for several weeks.  Patient is not sexually active and no concern for STD.  Urinalysis is normal.  I reviewed the ultrasound and showed no torsion but patient does have a hydrocele.  I recommend that he avoids sitting for long periods of time and avoid riding bicycle.  Gave strict return precautions   Problems Addressed: Hydrocele, unspecified hydrocele type: acute illness or injury  Amount and/or Complexity of Data Reviewed Labs: ordered. Decision-making details documented in ED Course.    Final Clinical Impression(s) / ED Diagnoses Final diagnoses:  None    Rx / DC Orders ED Discharge Orders     None         Charlynne Pander, MD 02/10/24 [phone]

## 2024-02-10 NOTE — ED Notes (Signed)
 Pt states that he rides his bicycle a lot and he feels increased discomfort in his left testicle when riding bike. D.m to EDP rearding additional orders

## 2024-02-10 NOTE — ED Triage Notes (Addendum)
 Pt is having left testicle bruising and warm to touch for about a month and a half.  Pt states that he was sitting on the ground mid february and he went to adjust his testicle and he states that it pulled it and the discomfort has been ongoing since then. No bruising visible to me in triage. Pt denies any tenderness to the touch. Pt is having left hip discomfort with this x several weeks

## 2024-02-10 NOTE — Discharge Instructions (Signed)
 You have hydrocele on your ultrasound.   Please take motrin for pain   Avoid heavy lifting.   Follow-up with urology as needed  Return to ER if you have worse testicular pain
